# Patient Record
Sex: Female | Born: 1985 | Race: Black or African American | Hispanic: No | Marital: Single | Smoking: Current every day smoker
Health system: Southern US, Community
[De-identification: ages and names within clinical notes are randomized; demographics above are authoritative.]

## PROBLEM LIST (undated history)

## (undated) DIAGNOSIS — F419 Anxiety disorder, unspecified: Secondary | ICD-10-CM

## (undated) DIAGNOSIS — F329 Major depressive disorder, single episode, unspecified: Secondary | ICD-10-CM

## (undated) DIAGNOSIS — J45909 Unspecified asthma, uncomplicated: Secondary | ICD-10-CM

## (undated) DIAGNOSIS — F32A Depression, unspecified: Secondary | ICD-10-CM

## (undated) HISTORY — PX: ARTHROSCOPIC REPAIR ACL: SUR80

---
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
## 2012-07-30 ENCOUNTER — Encounter (HOSPITAL_BASED_OUTPATIENT_CLINIC_OR_DEPARTMENT_OTHER): Payer: Self-pay

## 2012-07-30 ENCOUNTER — Emergency Department (HOSPITAL_BASED_OUTPATIENT_CLINIC_OR_DEPARTMENT_OTHER)
Admission: EM | Admit: 2012-07-30 | Discharge: 2012-07-31 | Disposition: A | Discharge status: Home / Self Care | Payer: BC Managed Care – PPO | Attending: Emergency Medicine | Admitting: Emergency Medicine

## 2012-07-30 ENCOUNTER — Emergency Department (HOSPITAL_BASED_OUTPATIENT_CLINIC_OR_DEPARTMENT_OTHER): Payer: BC Managed Care – PPO

## 2012-07-30 DIAGNOSIS — S93401A Sprain of unspecified ligament of right ankle, initial encounter: Secondary | ICD-10-CM

## 2012-07-30 DIAGNOSIS — Y9301 Activity, walking, marching and hiking: Secondary | ICD-10-CM | POA: Insufficient documentation

## 2012-07-30 DIAGNOSIS — S93409A Sprain of unspecified ligament of unspecified ankle, initial encounter: Secondary | ICD-10-CM | POA: Insufficient documentation

## 2012-07-30 DIAGNOSIS — X500XXA Overexertion from strenuous movement or load, initial encounter: Secondary | ICD-10-CM | POA: Insufficient documentation

## 2012-07-30 DIAGNOSIS — Z87891 Personal history of nicotine dependence: Secondary | ICD-10-CM | POA: Insufficient documentation

## 2012-07-30 DIAGNOSIS — Y9289 Other specified places as the place of occurrence of the external cause: Secondary | ICD-10-CM | POA: Insufficient documentation

## 2012-07-30 NOTE — ED Notes (Signed)
Pt reports jumping of porch and tripping over stick and injuring right ankle.

## 2012-07-31 MED ORDER — HYDROCODONE-ACETAMINOPHEN 5-325 MG PO TABS: 1.0000 | ORAL_TABLET | Freq: Four times a day (QID) | ORAL | Status: DC | PRN

## 2012-07-31 MED ORDER — NAPROXEN 250 MG PO TABS
500.0000 mg | ORAL_TABLET | Freq: Once | ORAL | Status: AC
  Administered 2012-07-31: 500 mg via ORAL
  Filled 2012-07-31: qty 2

## 2012-07-31 MED ORDER — HYDROCODONE-ACETAMINOPHEN 5-325 MG PO TABS
1.0000 | ORAL_TABLET | Freq: Once | ORAL | Status: AC
  Administered 2012-07-31: 1 via ORAL
  Filled 2012-07-31: qty 1

## 2012-07-31 NOTE — ED Provider Notes (Signed)
   History    CSN: [id number] Arrival date & time 07/30/12  2311  First MD Initiated Contact with Patient 07/31/12 0033     Chief Complaint  Patient presents with  . Ankle Injury    Right   (Consider location/radiation/quality/duration/timing/severity/associated sxs/prior Treatment) HPI Is a 27 year old female who twisted her ankle tripping over or stepping in an object she did not see all walking in the dark about 2 hours ago. She is having moderate pain over the lateral aspect of her right ankle with associated swelling. Pain is worse with movement or attempted ambulation. She denies other injury. There is no numbness, weakness or tendon dysfunction of the right foot distally.  History reviewed. No pertinent past medical history. Past Surgical History  Procedure Laterality Date  . Arthroscopic repair acl     No family history on file. History  Substance Use Topics  . Smoking status: Former Smoker -- 1.00 packs/day    Types: Cigarettes  . Smokeless tobacco: Not on file  . Alcohol Use: Yes     Comment: socially   OB History   Grav Para Term Preterm Abortions TAB SAB Ect Mult Living                 Review of Systems  All other systems reviewed and are negative.    Allergies  Review of patient's allergies indicates no known allergies.  Home Medications  No current outpatient prescriptions on file.  BP 118/74  Pulse 85  Temp(Src) 99.1 F (37.3 C) (Oral)  Resp 18  Ht 5\' 6"  (1.676 m)  Wt 175 lb (79.379 kg)  BMI 28.26 kg/m2  SpO2 100%  LMP 07/09/2012  Physical Exam General: Well-developed, well-nourished female in no acute distress; appearance consistent with age of record HENT: normocephalic, atraumatic Eyes: Normal appearance Neck: supple Heart: regular rate and rhythm Lungs: Normal respiratory effort and excursion Abdomen: soft; nondistended Extremities: Tenderness and swelling over right lateral malleolus, right ankle stable, right foot distally  neurovascularly intact, no proximal fibular tenderness Neurologic: Awake, alert and oriented; motor function intact in all extremities and symmetric; no facial droop Skin: Warm and dry Psychiatric: Normal mood and affect    ED Course  Procedures (including critical care time)   MDM    Hanley Seamen, MD 07/31/12 0041

## 2012-07-31 NOTE — ED Notes (Signed)
MD at bedside. 

## 2012-08-08 ENCOUNTER — Encounter (HOSPITAL_BASED_OUTPATIENT_CLINIC_OR_DEPARTMENT_OTHER): Payer: Self-pay | Admitting: *Deleted

## 2012-08-08 ENCOUNTER — Emergency Department (HOSPITAL_BASED_OUTPATIENT_CLINIC_OR_DEPARTMENT_OTHER)
Admission: EM | Admit: 2012-08-08 | Discharge: 2012-08-08 | Disposition: A | Discharge status: Home / Self Care | Payer: BC Managed Care – PPO | Attending: Emergency Medicine | Admitting: Emergency Medicine

## 2012-08-08 DIAGNOSIS — Y929 Unspecified place or not applicable: Secondary | ICD-10-CM | POA: Insufficient documentation

## 2012-08-08 DIAGNOSIS — Y939 Activity, unspecified: Secondary | ICD-10-CM | POA: Insufficient documentation

## 2012-08-08 DIAGNOSIS — Z9889 Other specified postprocedural states: Secondary | ICD-10-CM | POA: Insufficient documentation

## 2012-08-08 DIAGNOSIS — Z87891 Personal history of nicotine dependence: Secondary | ICD-10-CM | POA: Insufficient documentation

## 2012-08-08 DIAGNOSIS — S93409A Sprain of unspecified ligament of unspecified ankle, initial encounter: Secondary | ICD-10-CM | POA: Insufficient documentation

## 2012-08-08 DIAGNOSIS — S93401D Sprain of unspecified ligament of right ankle, subsequent encounter: Secondary | ICD-10-CM

## 2012-08-08 DIAGNOSIS — W19XXXA Unspecified fall, initial encounter: Secondary | ICD-10-CM | POA: Insufficient documentation

## 2012-08-08 NOTE — ED Notes (Signed)
Pt amb to room 9 with slow, steady gait in nad. Pt reports that since spraining her right ankle, she has been wearing a brace and now her right knee is having pain. Pt denies any other c/o, eating [employer] and smiling in nad.

## 2012-08-08 NOTE — ED Provider Notes (Signed)
CSN: [id number]     Arrival date & time 08/08/12  1026 History     First MD Initiated Contact with Patient 08/08/12 1037     Chief Complaint  Patient presents with  . Knee Pain   (Consider location/radiation/quality/duration/timing/severity/associated sxs/prior Treatment) Patient is a 27 y.o. female presenting with ankle pain. The history is provided by the patient.  Ankle Pain Location:  Ankle Time since incident:  8 days Injury: yes   Mechanism of injury: fall   Ankle location:  R ankle Pain details:    Radiates to: up to right knee. Chronicity:  New Relieved by: vicodin. Worsened by:  Activity Associated symptoms: swelling (improving since injury, now only minor)   Associated symptoms: no muscle weakness, no numbness and no tingling     History reviewed. No pertinent past medical history. Past Surgical History  Procedure Laterality Date  . Arthroscopic repair acl     History reviewed. No pertinent family history. History  Substance Use Topics  . Smoking status: Former Smoker -- 1.00 packs/day    Types: Cigarettes  . Smokeless tobacco: Not on file  . Alcohol Use: Yes     Comment: socially   OB History   Grav Para Term Preterm Abortions TAB SAB Ect Mult Living                 Review of Systems  Musculoskeletal: Positive for joint swelling.  Skin: Negative for color change and wound.  Neurological: Negative for weakness and numbness.  All other systems reviewed and are negative.    Allergies  Review of patient's allergies indicates no known allergies.  Home Medications   Current Outpatient Rx  Name  Route  Sig  Dispense  Refill  . HYDROcodone-acetaminophen (NORCO/VICODIN) 5-325 MG per tablet   Oral   Take 1-2 tablets by mouth every 6 (six) hours as needed for pain.   20 tablet   0    BP 113/85  Pulse 94  Temp(Src) 98.7 F (37.1 C) (Oral)  Resp 18  Ht 5\' 7"  (1.702 m)  Wt 180 lb (81.647 kg)  BMI 28.19 kg/m2  SpO2 100%  LMP  07/09/2012 Physical Exam  Nursing note and vitals reviewed. Constitutional: She appears well-developed and well-nourished. No distress.  HENT:  Head: Normocephalic and atraumatic.  Cardiovascular:  Pulses:      Dorsalis pedis pulses are 2+ on the right side, and 2+ on the left side.  Pulmonary/Chest: Effort normal.  Abdominal: She exhibits no distension.  Musculoskeletal:       Right knee: Normal. She exhibits normal range of motion and no swelling. No tenderness found.       Right ankle: She exhibits swelling (minimal laterally). She exhibits no deformity and normal pulse. Tenderness. Lateral malleolus tenderness found. Achilles tendon normal. Achilles tendon exhibits normal Thompson's test results.       Right lower leg: Normal. She exhibits no tenderness.  Neurological: No sensory deficit. She exhibits normal muscle tone. Gait normal.  Skin: Skin is warm and dry. No pallor.    ED Course   Procedures (including critical care time)  Labs Reviewed - No data to display No results found. 1. Right ankle sprain, subsequent encounter     MDM  27 year old female who sustained a mechanical right ankle sprain 8 days ago presents with pain radiating from her ankle up to her knee. She has been wearing an ankle brace has been able to walk normally but does not walk fully on her foot  to help relieve some of the pain. She has not had any of this pain when she is at rest. She has no neuro deficits. Her fibular head is nontender. Her knee exam is normal. She states she mostly came in to have disability paperwork filled out. I told her that I was unable to fill these out as I cannot follow up with the patient. As she sees to be improving from an ankle standpoint I will discharge her with outpatient referral to PCP and an orthopedist at her request  Audree Camel, MD 08/08/12 1144

## 2012-08-08 NOTE — ED Notes (Signed)
Pt sts she is too upset and will not sign discharge. Pt sts she is going to lose her job if she doesn't see someone to fill out paperwork. Pt left without written instructions and without signing. Pt mother returned for paperwork. Pt referred to ortho.

## 2012-08-12 ENCOUNTER — Ambulatory Visit (INDEPENDENT_AMBULATORY_CARE_PROVIDER_SITE_OTHER): Payer: BC Managed Care – PPO | Admitting: Family Medicine

## 2012-08-12 ENCOUNTER — Encounter: Payer: Self-pay | Admitting: Family Medicine

## 2012-08-12 VITALS — BP 125/79 | HR 105 | Ht 67.0 in | Wt 185.0 lb

## 2012-08-12 DIAGNOSIS — S99919A Unspecified injury of unspecified ankle, initial encounter: Secondary | ICD-10-CM

## 2012-08-12 DIAGNOSIS — S99911A Unspecified injury of right ankle, initial encounter: Secondary | ICD-10-CM

## 2012-08-12 DIAGNOSIS — S93409A Sprain of unspecified ligament of unspecified ankle, initial encounter: Secondary | ICD-10-CM

## 2012-08-12 MED ORDER — HYDROCODONE-ACETAMINOPHEN 5-325 MG PO TABS: 1.0000 | ORAL_TABLET | Freq: Four times a day (QID) | ORAL | Status: DC | PRN

## 2012-08-12 NOTE — Patient Instructions (Addendum)
You have an ankle sprain. Ice the area for 15 minutes at a time, 3-4 times a day Take aleve 1-2 tabs twice a day with food for 1 week then as needed. Norco as needed for severe pain - no driving on this. Elevate above the level of your heart when possible Crutches if needed to help with walking Bear weight when tolerated Use laceup ankle brace to help with stability while you recover from this injury. Come out of the boot/brace twice a day to do Up/down and alphabet exercises 2-3 sets of each. Start physical therapy for strengthening and balance exercises and do home exercises daily that they show you. Follow up with me in 2 weeks for reevaluation. Out of work in the meantime.

## 2012-08-13 ENCOUNTER — Encounter: Payer: Self-pay | Admitting: Family Medicine

## 2012-08-13 DIAGNOSIS — S99911A Unspecified injury of right ankle, initial encounter: Secondary | ICD-10-CM | POA: Insufficient documentation

## 2012-08-13 NOTE — Assessment & Plan Note (Signed)
2/2 sprain.  Brief u/s today also negative along with prior radiographs.  Start with ROM exercises, formal PT.  Icing, aleve, norco as needed.  ASO for stability first 6 weeks.  F/u in 2 weeks for reevaluation.  Has been held out of work - needs to be ambulatory for this.  Out of work until f/u.

## 2012-08-13 NOTE — Progress Notes (Signed)
Patient ID: Linda Frank, female   DOB: [birth year], 27 y.o.   MRN: [id number]  PCP: No primary provider on file.  Subjective:   HPI: Patient is a 27 y.o. female here for right ankle injury.  Patient reports on July 23 she accidentally tripped over a Training and development officer on edge of yard and turned right ankle (inversion). Immediate swelling after this. Went to ED - x-rays negative. Taking norco, wearing aso for support. No prior ankle injuries. Has not been icing. Used crutches the first week. Has been elevating.  History reviewed. No pertinent past medical history.  No current outpatient prescriptions on file prior to visit.   No current facility-administered medications on file prior to visit.    Past Surgical History  Procedure Laterality Date  . Arthroscopic repair acl      No Known Allergies  History   Social History  . Marital Status: Single    Spouse Name: N/A    Number of Children: N/A  . Years of Education: N/A   Occupational History  . Not on file.   Social History Main Topics  . Smoking status: Current Every Day Smoker -- 0.50 packs/day    Types: Cigarettes  . Smokeless tobacco: Not on file  . Alcohol Use: Yes     Comment: socially  . Drug Use: No  . Sexually Active: Yes    Birth Control/ Protection: None   Other Topics Concern  . Not on file   Social History Narrative  . No narrative on file    Family History  Problem Relation Age of Onset  . Hypertension Mother   . Hypertension Father   . Hypertension Brother   . Sudden death Neg Hx   . Hyperlipidemia Neg Hx   . Heart attack Neg Hx   . Diabetes Neg Hx     BP 125/79  Pulse 105  Ht 5\' 7"  (1.702 m)  Wt 185 lb (83.915 kg)  BMI 28.97 kg/m2  LMP 07/09/2012  Review of Systems: See HPI above.    Objective:  Physical Exam:  Gen: NAD  R ankle: Mild lateral swelling, bruising.  No other deformity. Mild limitation motion all directions.  TTP greatest over ATFL, less lateral  malleolus. 1+ ant drawer and talar tilt.   Negative syndesmotic compression. Thompsons test negative. NV intact distally.    Assessment & Plan:  1. Right ankle injury - 2/2 sprain.  Brief u/s today also negative along with prior radiographs.  Start with ROM exercises, formal PT.  Icing, aleve, norco as needed.  ASO for stability first 6 weeks.  F/u in 2 weeks for reevaluation.  Has been held out of work - needs to be ambulatory for this.  Out of work until f/u.

## 2012-08-20 ENCOUNTER — Ambulatory Visit: Payer: BC Managed Care – PPO | Attending: Family Medicine | Admitting: Rehabilitation

## 2012-08-20 DIAGNOSIS — R609 Edema, unspecified: Secondary | ICD-10-CM | POA: Insufficient documentation

## 2012-08-20 DIAGNOSIS — M2560 Stiffness of unspecified joint, not elsewhere classified: Secondary | ICD-10-CM | POA: Insufficient documentation

## 2012-08-20 DIAGNOSIS — IMO0001 Reserved for inherently not codable concepts without codable children: Secondary | ICD-10-CM | POA: Insufficient documentation

## 2012-08-20 DIAGNOSIS — M25579 Pain in unspecified ankle and joints of unspecified foot: Secondary | ICD-10-CM | POA: Insufficient documentation

## 2012-08-22 ENCOUNTER — Ambulatory Visit: Payer: BC Managed Care – PPO | Admitting: Rehabilitation

## 2012-08-27 ENCOUNTER — Ambulatory Visit: Payer: BC Managed Care – PPO | Admitting: Rehabilitation

## 2012-08-28 ENCOUNTER — Ambulatory Visit (INDEPENDENT_AMBULATORY_CARE_PROVIDER_SITE_OTHER): Payer: BC Managed Care – PPO | Admitting: Family Medicine

## 2012-08-28 ENCOUNTER — Encounter: Payer: Self-pay | Admitting: Family Medicine

## 2012-08-28 VITALS — BP 135/90 | HR 92 | Ht 67.0 in | Wt 185.0 lb

## 2012-08-28 DIAGNOSIS — Z5189 Encounter for other specified aftercare: Secondary | ICD-10-CM

## 2012-08-28 DIAGNOSIS — S99911D Unspecified injury of right ankle, subsequent encounter: Secondary | ICD-10-CM

## 2012-08-28 DIAGNOSIS — M25579 Pain in unspecified ankle and joints of unspecified foot: Secondary | ICD-10-CM

## 2012-08-28 DIAGNOSIS — M25571 Pain in right ankle and joints of right foot: Secondary | ICD-10-CM

## 2012-08-28 MED ORDER — TRAMADOL HCL 50 MG PO TABS: 50.0000 mg | ORAL_TABLET | Freq: Three times a day (TID) | ORAL | Status: DC | PRN

## 2012-08-28 NOTE — Patient Instructions (Addendum)
You have an ankle sprain. Ice the area for 15 minutes at a time, 3-4 times a day as needed. Take advil as you have been. Tramadol as needed for severe pain - no driving on this. Elevate above the level of your heart when possible Use laceup ankle brace to help with stability while you recover from this injury. Come out of the boot/brace twice a day to do Up/down and alphabet exercises 2-3 sets of each. Continue physical therapy for strengthening and balance exercises and do home exercises daily that they show you. Follow up with me in 4 weeks for reevaluation. Light duty as written then back to full duty in 3 weeks.

## 2012-08-29 ENCOUNTER — Encounter: Payer: Self-pay | Admitting: Family Medicine

## 2012-08-29 ENCOUNTER — Ambulatory Visit: Payer: BC Managed Care – PPO | Admitting: Rehabilitation

## 2012-08-29 NOTE — Assessment & Plan Note (Signed)
2/2 sprain.  Continue with PT and home exercises.  Icing, aleve, tramadol as needed.  ASO for stability.  F/u in 4 weeks for reevaluation.  Written for light duty for next 3 weeks (see letter).

## 2012-08-29 NOTE — Progress Notes (Signed)
Patient ID: Linda Frank, female   DOB: August 25, 1985, 27 y.o.   MRN: [id number]  PCP: No primary provider on file.  Subjective:   HPI: Patient is a 27 y.o. female here for f/u right ankle injury.  8/4: Patient reports on July 23 she accidentally tripped over a Training and development officer on edge of yard and turned right ankle (inversion). Immediate swelling after this. Went to ED - x-rays negative. Taking norco, wearing aso for support. No prior ankle injuries. Has not been icing. Used crutches the first week. Has been elevating.  8/20: Patient returns with some improvement since last visit. Doing PT and home exercise program. No longer icing. Taking advil and needed, out of norco. Using aso for stability. Off crutches now.  History reviewed. No pertinent past medical history.  No current outpatient prescriptions on file prior to visit.   No current facility-administered medications on file prior to visit.    Past Surgical History  Procedure Laterality Date  . Arthroscopic repair acl      No Known Allergies  History   Social History  . Marital Status: Single    Spouse Name: N/A    Number of Children: N/A  . Years of Education: N/A   Occupational History  . Not on file.   Social History Main Topics  . Smoking status: Current Every Day Smoker -- 0.50 packs/day    Types: Cigarettes  . Smokeless tobacco: Not on file  . Alcohol Use: Yes     Comment: socially  . Drug Use: No  . Sexual Activity: Yes    Birth Control/ Protection: None   Other Topics Concern  . Not on file   Social History Narrative  . No narrative on file    Family History  Problem Relation Age of Onset  . Hypertension Mother   . Hypertension Father   . Hypertension Brother   . Sudden death Neg Hx   . Hyperlipidemia Neg Hx   . Heart attack Neg Hx   . Diabetes Neg Hx     BP 135/90  Pulse 92  Ht 5\' 7"  (1.702 m)  Wt 185 lb (83.915 kg)  BMI 28.97 kg/m2  LMP 07/09/2012  Review of  Systems: See HPI above.    Objective:  Physical Exam:  Gen: NAD  R ankle: Mild lateral swelling, bruising.  No other deformity. Mild limitation motion all directions.  TTP mildly over ATFL.  No other TTP ankle/foot. 1+ ant drawer and talar tilt.   Negative syndesmotic compression. Thompsons test negative. NV intact distally.    Assessment & Plan:  1. Right ankle injury - 2/2 sprain.  Continue with PT and home exercises.  Icing, aleve, tramadol as needed.  ASO for stability.  F/u in 4 weeks for reevaluation.  Written for light duty for next 3 weeks (see letter).

## 2012-09-03 ENCOUNTER — Ambulatory Visit: Payer: BC Managed Care – PPO | Admitting: Rehabilitation

## 2012-09-05 ENCOUNTER — Ambulatory Visit: Payer: BC Managed Care – PPO | Admitting: Rehabilitation

## 2012-09-10 ENCOUNTER — Ambulatory Visit: Payer: BC Managed Care – PPO | Attending: Family Medicine | Admitting: Rehabilitation

## 2012-09-10 DIAGNOSIS — M2560 Stiffness of unspecified joint, not elsewhere classified: Secondary | ICD-10-CM | POA: Insufficient documentation

## 2012-09-10 DIAGNOSIS — IMO0001 Reserved for inherently not codable concepts without codable children: Secondary | ICD-10-CM | POA: Insufficient documentation

## 2012-09-10 DIAGNOSIS — M25579 Pain in unspecified ankle and joints of unspecified foot: Secondary | ICD-10-CM | POA: Insufficient documentation

## 2012-09-10 DIAGNOSIS — R609 Edema, unspecified: Secondary | ICD-10-CM | POA: Insufficient documentation

## 2012-09-12 ENCOUNTER — Ambulatory Visit: Payer: BC Managed Care – PPO | Admitting: Rehabilitation

## 2012-09-17 ENCOUNTER — Ambulatory Visit: Payer: BC Managed Care – PPO | Admitting: Rehabilitation

## 2012-09-19 ENCOUNTER — Ambulatory Visit: Payer: BC Managed Care – PPO | Admitting: Rehabilitation

## 2012-09-24 ENCOUNTER — Ambulatory Visit: Payer: BC Managed Care – PPO | Admitting: Rehabilitation

## 2012-09-25 ENCOUNTER — Ambulatory Visit (INDEPENDENT_AMBULATORY_CARE_PROVIDER_SITE_OTHER): Payer: BC Managed Care – PPO | Admitting: Family Medicine

## 2012-09-25 ENCOUNTER — Encounter: Payer: Self-pay | Admitting: Family Medicine

## 2012-09-25 VITALS — BP 120/75 | HR 87 | Ht 67.0 in | Wt 150.0 lb

## 2012-09-25 DIAGNOSIS — M25579 Pain in unspecified ankle and joints of unspecified foot: Secondary | ICD-10-CM

## 2012-09-25 DIAGNOSIS — Z5189 Encounter for other specified aftercare: Secondary | ICD-10-CM

## 2012-09-25 DIAGNOSIS — S99911D Unspecified injury of right ankle, subsequent encounter: Secondary | ICD-10-CM

## 2012-09-25 DIAGNOSIS — M25571 Pain in right ankle and joints of right foot: Secondary | ICD-10-CM

## 2012-09-25 NOTE — Patient Instructions (Addendum)
Continue with current treatment for now. We will go ahead with an MRI - I'll call you the next business day to go over results.

## 2012-09-26 ENCOUNTER — Encounter: Payer: Self-pay | Admitting: Family Medicine

## 2012-09-26 ENCOUNTER — Ambulatory Visit: Payer: BC Managed Care – PPO | Admitting: Rehabilitation

## 2012-09-26 NOTE — Progress Notes (Addendum)
Patient ID: Linda Frank, female   DOB: [birth year], 27 y.o.   MRN: [id number]  PCP: No PCP Per Patient  Subjective:   HPI: Patient is a 27 y.o. female here for f/u right ankle injury.  8/4: Patient reports on July 23 she accidentally tripped over a Training and development officer on edge of yard and turned right ankle (inversion). Immediate swelling after this. Went to ED - x-rays negative. Taking norco, wearing aso for support. No prior ankle injuries. Has not been icing. Used crutches the first week. Has been elevating.  8/20: Patient returns with some improvement since last visit. Doing PT and home exercise program. No longer icing. Taking advil and needed, out of norco. Using aso for stability. Off crutches now.  9/17: Patient continues to struggle with right ankle pain laterally despite formal PT, home exercises. Was wearing brace but recently stopped using this. Swells especially after full day at work. Pain currently 4/10. No new injuries.  History reviewed. No pertinent past medical history.  Current Outpatient Prescriptions on File Prior to Visit  Medication Sig Dispense Refill  . traMADol (ULTRAM) 50 MG tablet Take 1 tablet (50 mg total) by mouth every 8 (eight) hours as needed for pain.  90 tablet  0   No current facility-administered medications on file prior to visit.    Past Surgical History  Procedure Laterality Date  . Arthroscopic repair acl      No Known Allergies  History   Social History  . Marital Status: Single    Spouse Name: N/A    Number of Children: N/A  . Years of Education: N/A   Occupational History  . Not on file.   Social History Main Topics  . Smoking status: Current Every Day Smoker -- 0.50 packs/day    Types: Cigarettes  . Smokeless tobacco: Not on file  . Alcohol Use: Yes     Comment: socially  . Drug Use: No  . Sexual Activity: Yes    Birth Control/ Protection: None   Other Topics Concern  . Not on file   Social  History Narrative  . No narrative on file    Family History  Problem Relation Age of Onset  . Hypertension Mother   . Hypertension Father   . Hypertension Brother   . Sudden death Neg Hx   . Hyperlipidemia Neg Hx   . Heart attack Neg Hx   . Diabetes Neg Hx     BP 120/75  Pulse 87  Ht 5\' 7"  (1.702 m)  Wt 150 lb (68.04 kg)  BMI 23.49 kg/m2  Review of Systems: See HPI above.    Objective:  Physical Exam:  Gen: NAD  R ankle: Mild lateral swelling.  No other deformity. Mild limitation motion all directions.  TTP mildly over ATFL, lateral ankle joint.  No other TTP ankle/foot. 1+ ant drawer and talar tilt.   Negative syndesmotic compression. Thompsons test negative. NV intact distally.    Assessment & Plan:  1. Right ankle injury - 8 weeks out from injury and still sturggling with significant pain from injury.  Will move forward with MRI to further assess for osteochondral defect, occult talus fracture.  Continue current treatment in meantime.  Continue home exercises, icing, aleve, tramadol as needed.   Addendum:  MRI results reviewed and discussed with patient.  No occult fracture.  Consistent with sprain.  Encouraged to continue with home exercises, use ASO at work.  F/u as needed.

## 2012-09-26 NOTE — Addendum Note (Signed)
Addended by: Lenda Kelp on: 09/26/2012 04:16 PM   Modules accepted: Orders

## 2012-09-26 NOTE — Assessment & Plan Note (Signed)
8 weeks out from injury and still sturggling with significant pain from injury.  Will move forward with MRI to further assess for osteochondral defect, occult talus fracture.  Continue current treatment in meantime.  Continue home exercises, icing, aleve, tramadol as needed.

## 2012-09-28 ENCOUNTER — Ambulatory Visit (HOSPITAL_BASED_OUTPATIENT_CLINIC_OR_DEPARTMENT_OTHER)
Admission: RE | Admit: 2012-09-28 | Discharge: 2012-09-28 | Disposition: A | Discharge status: Home / Self Care | Payer: BC Managed Care – PPO | Source: Ambulatory Visit | Attending: Family Medicine | Admitting: Family Medicine

## 2012-09-28 DIAGNOSIS — S93499A Sprain of other ligament of unspecified ankle, initial encounter: Secondary | ICD-10-CM | POA: Insufficient documentation

## 2012-09-28 DIAGNOSIS — M25571 Pain in right ankle and joints of right foot: Secondary | ICD-10-CM

## 2012-09-28 DIAGNOSIS — X500XXA Overexertion from strenuous movement or load, initial encounter: Secondary | ICD-10-CM | POA: Insufficient documentation

## 2012-10-01 ENCOUNTER — Ambulatory Visit: Payer: BC Managed Care – PPO | Admitting: Rehabilitation

## 2012-10-03 ENCOUNTER — Ambulatory Visit: Payer: BC Managed Care – PPO | Admitting: Rehabilitation

## 2012-10-08 ENCOUNTER — Ambulatory Visit: Payer: BC Managed Care – PPO | Admitting: Rehabilitation

## 2012-10-10 ENCOUNTER — Ambulatory Visit: Payer: BC Managed Care – PPO | Admitting: Rehabilitation

## 2012-10-15 ENCOUNTER — Ambulatory Visit: Payer: BC Managed Care – PPO | Attending: Family Medicine | Admitting: Rehabilitation

## 2012-10-15 DIAGNOSIS — IMO0001 Reserved for inherently not codable concepts without codable children: Secondary | ICD-10-CM | POA: Insufficient documentation

## 2012-10-15 DIAGNOSIS — R609 Edema, unspecified: Secondary | ICD-10-CM | POA: Insufficient documentation

## 2012-10-15 DIAGNOSIS — M25579 Pain in unspecified ankle and joints of unspecified foot: Secondary | ICD-10-CM | POA: Insufficient documentation

## 2012-10-15 DIAGNOSIS — M2560 Stiffness of unspecified joint, not elsewhere classified: Secondary | ICD-10-CM | POA: Insufficient documentation

## 2012-10-17 ENCOUNTER — Ambulatory Visit: Payer: BC Managed Care – PPO | Admitting: Rehabilitation

## 2012-10-22 ENCOUNTER — Ambulatory Visit: Payer: BC Managed Care – PPO | Admitting: Rehabilitation

## 2012-10-24 ENCOUNTER — Ambulatory Visit: Payer: BC Managed Care – PPO | Admitting: Rehabilitation

## 2015-08-18 ENCOUNTER — Encounter (HOSPITAL_BASED_OUTPATIENT_CLINIC_OR_DEPARTMENT_OTHER): Payer: Self-pay | Admitting: Emergency Medicine

## 2015-08-18 ENCOUNTER — Emergency Department (HOSPITAL_BASED_OUTPATIENT_CLINIC_OR_DEPARTMENT_OTHER)
Admission: EM | Admit: 2015-08-18 | Discharge: 2015-08-19 | Disposition: A | Discharge status: Home / Self Care | Payer: BLUE CROSS/BLUE SHIELD | Attending: Emergency Medicine | Admitting: Emergency Medicine

## 2015-08-18 DIAGNOSIS — J45909 Unspecified asthma, uncomplicated: Secondary | ICD-10-CM | POA: Diagnosis not present

## 2015-08-18 DIAGNOSIS — R002 Palpitations: Secondary | ICD-10-CM | POA: Diagnosis not present

## 2015-08-18 DIAGNOSIS — F17210 Nicotine dependence, cigarettes, uncomplicated: Secondary | ICD-10-CM | POA: Diagnosis not present

## 2015-08-18 DIAGNOSIS — F419 Anxiety disorder, unspecified: Secondary | ICD-10-CM | POA: Insufficient documentation

## 2015-08-18 DIAGNOSIS — R0602 Shortness of breath: Secondary | ICD-10-CM | POA: Diagnosis present

## 2015-08-18 HISTORY — DX: Anxiety disorder, unspecified: F41.9

## 2015-08-18 HISTORY — DX: Major depressive disorder, single episode, unspecified: F32.9

## 2015-08-18 HISTORY — DX: Depression, unspecified: F32.A

## 2015-08-18 HISTORY — DX: Unspecified asthma, uncomplicated: J45.909

## 2015-08-18 NOTE — ED Triage Notes (Signed)
Pt c/o shortness of breath. Pt has hx of asthma. Pt states there is mold in her house and there are renovations going on.  Pt also c/o nausea, palpitations, and rash.

## 2015-08-18 NOTE — ED Provider Notes (Signed)
[facility] Provider Note   CSN: [id number] Arrival date & time: 08/18/15  2331  First Provider Contact:  First MD Initiated Contact with Patient 08/18/15 2342     By signing my name below, I, Emmanuella Mensah, attest that this documentation has been prepared under the direction and in the presence of Roxy Horseman, PA-C. Electronically Signed: Angelene Giovanni, ED Scribe. 08/18/15. 11:53 PM.    History   Chief Complaint Chief Complaint  Patient presents with  . Shortness of Breath   HPI Comments: Linda Frank is a 30 y.o. female with a hx of Asthma who presents to the Emergency Department complaining of ongoing moderate shortness of breath onset this am when she woke up. She reports associated heart palpitations. She explains that she recently discovered black mold at her home where she has lived for 5 years. She reports that she saw her PCP for these symptoms and was prescribed Xanax and a "depression medication". No other alleviating factors noted. Pt has not tried any medications PTA. No fever, chills, cough, rash, chest pain, or n/v.   The history is provided by the patient. No language interpreter was used.    Past Medical History:  Diagnosis Date  . Asthma     Patient Active Problem List   Diagnosis Date Noted  . Right ankle injury 08/13/2012    Past Surgical History:  Procedure Laterality Date  . ARTHROSCOPIC REPAIR ACL      OB History    No data available       Home Medications    Prior to Admission medications   Medication Sig Start Date End Date Taking? Authorizing Provider  traMADol (ULTRAM) 50 MG tablet Take 1 tablet (50 mg total) by mouth every 8 (eight) hours as needed for pain. 08/28/12   Lenda Kelp, MD    Family History Family History  Problem Relation Age of Onset  . Hypertension Mother   . Hypertension Father   . Hypertension Brother   . Sudden death Neg Hx   . Hyperlipidemia Neg Hx   . Heart attack Neg Hx   .  Diabetes Neg Hx     Social History Social History  Substance Use Topics  . Smoking status: Current Every Day Smoker    Packs/day: 0.50    Types: Cigarettes  . Smokeless tobacco: Never Used  . Alcohol use Yes     Comment: socially     Allergies   Review of patient's allergies indicates no known allergies.   Review of Systems Review of Systems  Constitutional: Negative for chills and fever.  Respiratory: Positive for shortness of breath. Negative for cough.   Cardiovascular: Positive for palpitations. Negative for chest pain.  Gastrointestinal: Negative for nausea and vomiting.  Skin: Negative for rash.     Physical Exam Updated Vital Signs BP 156/87   Pulse 93   Temp 98.2 F (36.8 C) (Oral)   Resp 18   Ht  (1.676 m)   Wt 200 lb (90.7 kg)   SpO2 100%   BMI 32.28 kg/m   Physical Exam  Constitutional: She is oriented to person, place, and time. She appears well-developed and well-nourished.  HENT:  Head: Normocephalic and atraumatic.  Eyes: Conjunctivae and EOM are normal. Pupils are equal, round, and reactive to light.  Neck: Normal range of motion. Neck supple.  Cardiovascular: Normal rate and regular rhythm.  Exam reveals no gallop and no friction rub.   No murmur heard. Pulmonary/Chest: Effort normal and  breath sounds normal. No respiratory distress. She has no wheezes. She has no rales. She exhibits no tenderness.  CTAB  Abdominal: Soft. Bowel sounds are normal. She exhibits no distension and no mass. There is no tenderness. There is no rebound and no guarding.  Musculoskeletal: Normal range of motion. She exhibits no edema or tenderness.  Neurological: She is alert and oriented to person, place, and time.  Skin: Skin is warm and dry.  Psychiatric: She has a normal mood and affect. Her behavior is normal. Judgment and thought content normal.  anxious  Nursing note and vitals reviewed.    ED Treatments / Results  DIAGNOSTIC STUDIES: Oxygen  Saturation is 100% on RA, normal by my interpretation.    COORDINATION OF CARE: 11:50 PM - Pt's parents advised of plan for treatment and pt's parents agree. Pt counseled on anxiety related to the discovery of mold.    Labs (all labs ordered are listed, but only abnormal results are displayed) Labs Reviewed - No data to display  EKG  EKG Interpretation None       Radiology No results found.  Procedures Procedures (including critical care time)  Medications Ordered in ED Medications - No data to display   Initial Impression / Assessment and Plan / ED Course  Roxy Horsemanobert Casimer Russett, PA-C has reviewed the triage vital signs and the nursing notes.  Pertinent labs & imaging results that were available during my care of the patient were reviewed by me and considered in my medical decision making (see chart for details).  Clinical Course    Patient with anxiety.  States that she awoke this evening with some heart palpitations.  She doesn't have chest pain.  She states that her doctor recently gave her something for anxiety and depression.  She states that it isn't working.  She is concerned because she lives in a home where black mold was found. Her lungs are clear.  No cough.  No fever.  Recommend PCP follow-up.  Final Clinical Impressions(s) / ED Diagnoses   Final diagnoses:  Anxiety   I personally performed the services described in this documentation, which was scribed in my presence. The recorded information has been reviewed and is accurate.     New Prescriptions New Prescriptions   No medications on file     Roxy HorsemanRobert Viki Carrera, PA-C 08/19/15 0007    Paula LibraJohn Molpus, MD 08/19/15 [phone]

## 2015-08-18 NOTE — ED Notes (Signed)
Pt has generalized complaints of tiredness, SOB, cough, intermittent nausea for the last 2-3 years.  She states that her apartment is being renovated and they found a lot of mold and was wondering if there was a test to see if her symptoms were from mold.  Pt's lungs are clear, she is able to speak in complete sentences without difficulty and is not in any acute distress currently.

## 2015-08-19 NOTE — ED Notes (Signed)
Pt verbalizes understanding of d/c instructions and denies any further needs at this time. 

## 2016-03-07 ENCOUNTER — Emergency Department (HOSPITAL_BASED_OUTPATIENT_CLINIC_OR_DEPARTMENT_OTHER): Payer: BLUE CROSS/BLUE SHIELD

## 2016-03-07 ENCOUNTER — Encounter (HOSPITAL_BASED_OUTPATIENT_CLINIC_OR_DEPARTMENT_OTHER): Payer: Self-pay | Admitting: Emergency Medicine

## 2016-03-07 ENCOUNTER — Emergency Department (HOSPITAL_BASED_OUTPATIENT_CLINIC_OR_DEPARTMENT_OTHER)
Admission: EM | Admit: 2016-03-07 | Discharge: 2016-03-07 | Disposition: A | Discharge status: Home / Self Care | Payer: BLUE CROSS/BLUE SHIELD | Attending: Emergency Medicine | Admitting: Emergency Medicine

## 2016-03-07 DIAGNOSIS — Y929 Unspecified place or not applicable: Secondary | ICD-10-CM | POA: Insufficient documentation

## 2016-03-07 DIAGNOSIS — W231XXA Caught, crushed, jammed, or pinched between stationary objects, initial encounter: Secondary | ICD-10-CM | POA: Insufficient documentation

## 2016-03-07 DIAGNOSIS — J45909 Unspecified asthma, uncomplicated: Secondary | ICD-10-CM | POA: Insufficient documentation

## 2016-03-07 DIAGNOSIS — M79645 Pain in left finger(s): Secondary | ICD-10-CM | POA: Insufficient documentation

## 2016-03-07 DIAGNOSIS — Y999 Unspecified external cause status: Secondary | ICD-10-CM | POA: Insufficient documentation

## 2016-03-07 DIAGNOSIS — Y9389 Activity, other specified: Secondary | ICD-10-CM | POA: Insufficient documentation

## 2016-03-07 DIAGNOSIS — G8929 Other chronic pain: Secondary | ICD-10-CM | POA: Insufficient documentation

## 2016-03-07 DIAGNOSIS — Z79899 Other long term (current) drug therapy: Secondary | ICD-10-CM | POA: Insufficient documentation

## 2016-03-07 DIAGNOSIS — F17210 Nicotine dependence, cigarettes, uncomplicated: Secondary | ICD-10-CM | POA: Insufficient documentation

## 2016-03-07 DIAGNOSIS — M79641 Pain in right hand: Secondary | ICD-10-CM | POA: Insufficient documentation

## 2016-03-07 NOTE — ED Triage Notes (Signed)
Pain to L index finger after jamming it while moving a couch. Swelling noted. Pain to R hand across the knuckles x 1-2 months, described as an ache, no known injury.

## 2016-03-07 NOTE — ED Provider Notes (Signed)
[facility]   CSN: [id number] Arrival date & time: 03/07/16  0850     History   Chief Complaint Chief Complaint  Patient presents with  . Hand Pain    HPI Linda Frank is a 31 y.o. right-handed female who presents with chronic right hand pain and acute left index finger pain. She states that her right hand pain has been ongoing for several months. It is worse when she grips or makes a fist. The pain is over the top of her knuckles between the index and ring finger. It is better when she rests and uses a brace. She has been seen by her PCP for this problem who gave her pain medication but this did not relieve her pain completely. Her job involves using her hands constantly. She denies numbness, tingling, or weakness of her hand. She has been taking 600-800 mg ibuprofen with some relief of her pain.  Her left index finger she hurt yesterday while she was moving a couch. He got stuck in between the couch and the door and she has some swelling on the dorsal aspect of her finger. She has full range of motion of that finger but it is painful.  HPI  Past Medical History:  Diagnosis Date  . Anxiety   . Asthma   . Depressive disorder     Patient Active Problem List   Diagnosis Date Noted  . Right ankle injury 08/13/2012    Past Surgical History:  Procedure Laterality Date  . ARTHROSCOPIC REPAIR ACL      OB History    No data available       Home Medications    Prior to Admission medications   Medication Sig Start Date End Date Taking? Authorizing Provider  FLUoxetine (PROZAC) 40 MG capsule Take 40 mg by mouth daily.   Yes Historical Provider, MD  hydrOXYzine (VISTARIL) 50 MG capsule Take 50 mg by mouth 3 (three) times daily as needed.    Yes Historical Provider, MD  traMADol (ULTRAM) 50 MG tablet Take 1 tablet (50 mg total) by mouth every 8 (eight) hours as needed for pain. 08/28/12   Lenda Kelp, MD    Family History Family History    Problem Relation Age of Onset  . Hypertension Mother   . Hypertension Father   . Hypertension Brother   . Sudden death Neg Hx   . Hyperlipidemia Neg Hx   . Heart attack Neg Hx   . Diabetes Neg Hx     Social History Social History  Substance Use Topics  . Smoking status: Current Every Day Smoker    Packs/day: 0.50    Types: Cigarettes  . Smokeless tobacco: Never Used  . Alcohol use Yes     Comment: socially     Allergies   Patient has no known allergies.   Review of Systems Review of Systems  Musculoskeletal: Positive for arthralgias and joint swelling.  Skin: Negative for wound.  Neurological: Negative for weakness and numbness.     Physical Exam Updated Vital Signs BP 124/72 (BP Location: Right Arm)   Pulse 73   Temp 98.4 F (36.9 C) (Oral)   Resp 18   Ht 5\' 7"  (1.702 m)   Wt 104.3 kg   LMP 02/24/2016   SpO2 100%   BMI 36.02 kg/m   Physical Exam  Constitutional: She is oriented to person, place, and time. She appears well-developed and well-nourished. No distress.  HENT:  Head: Normocephalic and atraumatic.  Eyes: Conjunctivae are normal. Pupils are equal, round, and reactive to light. Right eye exhibits no discharge. Left eye exhibits no discharge. No scleral icterus.  Neck: Normal range of motion.  Cardiovascular: Normal rate.   Pulmonary/Chest: Effort normal. No respiratory distress.  Abdominal: She exhibits no distension.  Musculoskeletal:  Right hand: No obvious swelling or deformity. No tenderness to palpation but she has pain in between the index and middle finger and middle and ring finger with flexion. FROM. N/V intact.  Left hand: Mild swelling of dorsal aspect of left index finger. No deformity. Mild tenderness to palpation. FROM of finger. N/V intact.   Neurological: She is alert and oriented to person, place, and time.  Skin: Skin is warm and dry.  Psychiatric: She has a normal mood and affect. Her behavior is normal.  Nursing note and  vitals reviewed.    ED Treatments / Results  Labs (all labs ordered are listed, but only abnormal results are displayed) Labs Reviewed - No data to display  EKG  EKG Interpretation None       Radiology Dg Hand Complete Right  Result Date: 03/07/2016 CLINICAL DATA:  31 year old female with left index finger pain and swelling after trauma 2 days ago. Initial encounter. EXAM: RIGHT HAND - COMPLETE 3+ VIEW COMPARISON:  07/12/2015. Left index finger films same date dictated separately. FINDINGS: There is no evidence of fracture or dislocation. There is no evidence of arthropathy or other focal bone abnormality. Soft tissues are unremarkable. IMPRESSION: Negative. Electronically Signed   By: Lacy DuverneySteven  Olson M.D.   On: 03/07/2016 09:40   Dg Finger Index Left  Result Date: 03/07/2016 CLINICAL DATA:  31 year old female with left index finger pain and swelling after trauma 2 days ago. Initial encounter. EXAM: LEFT INDEX FINGER 2+V COMPARISON:  Left hand same date dictated separately. FINDINGS: No fracture or dislocation. IMPRESSION: No fracture or dislocation. Electronically Signed   By: Lacy DuverneySteven  Olson M.D.   On: 03/07/2016 09:38    Procedures Procedures (including critical care time)  Medications Ordered in ED Medications - No data to display   Initial Impression / Assessment and Plan / ED Course  I have reviewed the triage vital signs and the nursing notes.  Pertinent labs & imaging results that were available during my care of the patient were reviewed by me and considered in my medical decision making (see chart for details).  31 year old female presents with chronic right hand pain likely due to overuse from her job. As her pain is been going on for several months ago without resolution advised her to follow up with hand specialist. In the meantime encouraged NSAIDs, ice, bracing as needed. X-ray is negative for any bony pathology. X-ray of Left index finger also negative for any bony  pathology. She has full range of motion of the finger or doubt tendon injury. Advised supportive care and follow-up with PCP. Return precautions given.  Final Clinical Impressions(s) / ED Diagnoses   Final diagnoses:  Chronic hand pain, right  Finger pain, left    New Prescriptions Discharge Medication List as of 03/07/2016 10:33 AM       Bethel BornKelly Marie Labrina Lines, PA-C 03/08/16 1044    Pricilla LovelessScott Goldston, MD 03/09/16 2100

## 2016-03-07 NOTE — Discharge Instructions (Signed)
Take Ibuprofen as needed for pain. You can take Tylenol with this medicine Ice hand as needed Wear brace as needed Follow up with Dr. Amanda PeaGramig for further evaluation

## 2016-06-03 ENCOUNTER — Encounter (HOSPITAL_BASED_OUTPATIENT_CLINIC_OR_DEPARTMENT_OTHER): Payer: Self-pay | Admitting: Emergency Medicine

## 2016-06-03 ENCOUNTER — Emergency Department (HOSPITAL_BASED_OUTPATIENT_CLINIC_OR_DEPARTMENT_OTHER)
Admission: EM | Admit: 2016-06-03 | Discharge: 2016-06-03 | Disposition: A | Discharge status: Home / Self Care | Payer: BLUE CROSS/BLUE SHIELD | Attending: Emergency Medicine | Admitting: Emergency Medicine

## 2016-06-03 DIAGNOSIS — Y999 Unspecified external cause status: Secondary | ICD-10-CM | POA: Insufficient documentation

## 2016-06-03 DIAGNOSIS — W2103XA Struck by baseball, initial encounter: Secondary | ICD-10-CM | POA: Insufficient documentation

## 2016-06-03 DIAGNOSIS — J45909 Unspecified asthma, uncomplicated: Secondary | ICD-10-CM | POA: Insufficient documentation

## 2016-06-03 DIAGNOSIS — S058X2A Other injuries of left eye and orbit, initial encounter: Secondary | ICD-10-CM | POA: Insufficient documentation

## 2016-06-03 DIAGNOSIS — Y9239 Other specified sports and athletic area as the place of occurrence of the external cause: Secondary | ICD-10-CM | POA: Insufficient documentation

## 2016-06-03 DIAGNOSIS — F17210 Nicotine dependence, cigarettes, uncomplicated: Secondary | ICD-10-CM | POA: Insufficient documentation

## 2016-06-03 DIAGNOSIS — Y9364 Activity, baseball: Secondary | ICD-10-CM | POA: Insufficient documentation

## 2016-06-03 DIAGNOSIS — S0592XA Unspecified injury of left eye and orbit, initial encounter: Secondary | ICD-10-CM

## 2016-06-03 MED ORDER — FLUORESCEIN SODIUM 0.6 MG OP STRP
ORAL_STRIP | OPHTHALMIC | Status: AC
  Filled 2016-06-03: qty 1

## 2016-06-03 MED ORDER — TETRACAINE HCL 0.5 % OP SOLN
OPHTHALMIC | Status: AC
  Administered 2016-06-03: 1 [drp] via OPHTHALMIC
  Filled 2016-06-03: qty 4

## 2016-06-03 MED ORDER — TETRACAINE HCL 0.5 % OP SOLN: 1.0000 [drp] | Freq: Once | OPHTHALMIC | Status: AC

## 2016-06-03 NOTE — ED Triage Notes (Signed)
PT presents to ED with complaints of left eye pain after [facility]

## 2016-06-03 NOTE — ED Provider Notes (Signed)
[facility]   CSN: [id number] Arrival date & time: 06/03/16  2143  By signing my name below, I, Thelma Barge, attest that this documentation has been prepared under the direction and in the presence of Dalisha Shively PA-C. Electronically Signed: Thelma Barge, Scribe. 06/03/16. 10:37 PM.  History   Chief Complaint Chief Complaint  Patient presents with  . Eye Injury   The history is provided by the patient. No language interpreter was used.   HPI Comments: Linda Frank is a 31 y.o. female who presents to the Emergency Department complaining of constant, gradually worsening left eye pain s/p getting hit in the left eye socket with a baseball that occurred 8 hours ago. She describes the pain as a pressure and it worsens when she moves her eyeball around and exposed to bright lights. She has associated sensitivity to light and mild dizziness. She denies nausea, vomiting, difficulty walking, LOC, head injury, blurry vision, Double vision, loss of vision She has no pertinent history of illness or disease.   Past Medical History:  Diagnosis Date  . Anxiety   . Asthma   . Depressive disorder     Patient Active Problem List   Diagnosis Date Noted  . Right ankle injury 08/13/2012    Past Surgical History:  Procedure Laterality Date  . ARTHROSCOPIC REPAIR ACL      OB History    No data available       Home Medications    Prior to Admission medications   Medication Sig Start Date End Date Taking? Authorizing Provider  FLUoxetine (PROZAC) 40 MG capsule Take 40 mg by mouth daily.    [provider]  hydrOXYzine (VISTARIL) 50 MG capsule Take 50 mg by mouth 3 (three) times daily as needed.     [provider]  traMADol (ULTRAM) 50 MG tablet Take 1 tablet (50 mg total) by mouth every 8 (eight) hours as needed for pain. 08/28/12   Lenda Kelp, MD    Family History Family History  Problem Relation Age of Onset  . Hypertension  Mother   . Hypertension Father   . Hypertension Brother   . Sudden death Neg Hx   . Hyperlipidemia Neg Hx   . Heart attack Neg Hx   . Diabetes Neg Hx     Social History Social History  Substance Use Topics  . Smoking status: Current Every Day Smoker    Packs/day: 0.50    Types: Cigarettes  . Smokeless tobacco: Never Used  . Alcohol use Yes     Comment: socially     Allergies   Patient has no known allergies.   Review of Systems Review of Systems  Constitutional: Negative for fever.  HENT: Negative for facial swelling and nosebleeds.   Eyes: Positive for photophobia and pain (left eye). Negative for visual disturbance.  Gastrointestinal: Negative for nausea and vomiting.  Neurological: Positive for dizziness (mild) and headaches. Negative for syncope.     Physical Exam Updated Vital Signs BP 127/76   Pulse 89   Temp 98.9 F (37.2 C) (Oral) Comment (Src): vitals taken by Baxter Hire, Pegram,EMT  Resp 18   LMP 05/08/2016   SpO2 99%   Physical Exam  Constitutional: She appears well-developed and well-nourished. No distress.  HENT:  Head: Normocephalic and atraumatic.  Eyes: EOM are normal. Pupils are equal, round, and reactive to light. Right eye exhibits no discharge. Left eye exhibits no discharge. Left conjunctiva is injected. No scleral icterus. Left eye  exhibits normal extraocular motion.  Pain/discomfort with eye movements  Neck: Normal range of motion.  Pulmonary/Chest: Effort normal. No respiratory distress.  Neurological: She is alert. She exhibits normal muscle tone.  Skin: No rash noted. She is not diaphoretic.  Psychiatric: She has a normal mood and affect.  Nursing note and vitals reviewed.    ED Treatments / Results  DIAGNOSTIC STUDIES: Oxygen Saturation is 99% on room air, normal by my interpretation.    COORDINATION OF CARE: 10:35 PM Discussed treatment plan with pt at bedside and pt agreed to plan.  Labs (all labs ordered are listed, but  only abnormal results are displayed) Labs Reviewed - No data to display  EKG  EKG Interpretation None       Radiology No results found.  Procedures Procedures (including critical care time)  Medications Ordered in ED Medications  fluorescein 0.6 MG ophthalmic strip (not administered)  tetracaine (PONTOCAINE) 0.5 % ophthalmic solution 1 drop (1 drop Left Eye Given by Other 06/03/16 2327)     Initial Impression / Assessment and Plan / ED Course  I have reviewed the triage vital signs and the nursing notes.  Pertinent labs & imaging results that were available during my care of the patient were reviewed by me and considered in my medical decision making (see chart for details).     Patient's history and symptoms concerning for left eye iritis. Ocular pressure was checked and was 19. There is injection of the conjunctiva on the left side. Pupils are equal and reactive bilaterally, and not deformed. Patient has no blurry vision, double vision or loss of vision. No concern for any hematoma behind the eye. Patient denies loss of consciousness or neurological deficits. No need for further imaging at this time. I advised her to wear sunglasses, no contact lenses and should follow up with ophthalmologist as soon as possible. Continue anti-inflammatories for pain control.  Final Clinical Impressions(s) / ED Diagnoses   Final diagnoses:  Left eye injury, initial encounter    New Prescriptions Discharge Medication List as of 06/03/2016 11:27 PM     I personally performed the services described in this documentation, which was scribed in my presence. The recorded information has been reviewed and is accurate.     Dietrich PatesKhatri, Rashida Ladouceur, PA-C 06/04/16 [id number]    Gwyneth SproutPlunkett, Whitney, MD 06/04/16 [phone]

## 2016-06-03 NOTE — ED Notes (Signed)
Pt was struck by a ball in the L eye earlier this afternoon. Now c/o blurred vision, and photosensitivity.

## 2016-06-03 NOTE — Discharge Instructions (Signed)
Do not wear contact lenses until being seen by eye doctor. Wear sunglasses when exposed to sunlight or bright lights. Follow-up with eye doctor listed below for further evaluation. Continue ibuprofen for pain and discomfort. Return to ED for worsening pain, additional injury, vision changes, loss of vision, headache injury or loss of consciousness.

## 2018-09-07 ENCOUNTER — Emergency Department (HOSPITAL_BASED_OUTPATIENT_CLINIC_OR_DEPARTMENT_OTHER): Payer: Self-pay

## 2018-09-07 ENCOUNTER — Emergency Department (HOSPITAL_BASED_OUTPATIENT_CLINIC_OR_DEPARTMENT_OTHER)
Admission: EM | Admit: 2018-09-07 | Discharge: 2018-09-07 | Disposition: A | Discharge status: Home / Self Care | Payer: Self-pay | Attending: Emergency Medicine | Admitting: Emergency Medicine

## 2018-09-07 ENCOUNTER — Other Ambulatory Visit: Payer: Self-pay

## 2018-09-07 ENCOUNTER — Encounter (HOSPITAL_BASED_OUTPATIENT_CLINIC_OR_DEPARTMENT_OTHER): Payer: Self-pay | Admitting: Emergency Medicine

## 2018-09-07 DIAGNOSIS — Y998 Other external cause status: Secondary | ICD-10-CM | POA: Insufficient documentation

## 2018-09-07 DIAGNOSIS — Y9389 Activity, other specified: Secondary | ICD-10-CM | POA: Insufficient documentation

## 2018-09-07 DIAGNOSIS — Y9289 Other specified places as the place of occurrence of the external cause: Secondary | ICD-10-CM | POA: Insufficient documentation

## 2018-09-07 DIAGNOSIS — S93402A Sprain of unspecified ligament of left ankle, initial encounter: Secondary | ICD-10-CM | POA: Insufficient documentation

## 2018-09-07 DIAGNOSIS — X500XXA Overexertion from strenuous movement or load, initial encounter: Secondary | ICD-10-CM | POA: Insufficient documentation

## 2018-09-07 NOTE — ED Provider Notes (Signed)
[facility] EMERGENCY DEPARTMENT Provider Note   CSN: [id number] Arrival date & time: 09/07/18  1016     History   Chief Complaint Chief Complaint  Patient presents with  . Ankle Injury    HPI Linda Frank is a 33 y.o. female.     HPI Patient presents with left ankle pain.  States she was at work and twisted her left ankle last night.  States she felt 3 pops in it.  States is able to walk on it but there is pain.  Patient states she thinks she could have broken her fibula.  No other injury. Past Medical History:  Diagnosis Date  . Anxiety   . Asthma   . Depressive disorder     Patient Active Problem List   Diagnosis Date Noted  . Right ankle injury 08/13/2012    Past Surgical History:  Procedure Laterality Date  . ARTHROSCOPIC REPAIR ACL       OB History   No obstetric history on file.      Home Medications    Prior to Admission medications   Medication Sig Start Date End Date Taking? Authorizing Provider  FLUoxetine (PROZAC) 40 MG capsule Take 40 mg by mouth daily.    [provider]  hydrOXYzine (VISTARIL) 50 MG capsule Take 50 mg by mouth 3 (three) times daily as needed.     [provider]  traMADol (ULTRAM) 50 MG tablet Take 1 tablet (50 mg total) by mouth every 8 (eight) hours as needed for pain. 08/28/12   Dene Gentry, MD    Family History Family History  Problem Relation Age of Onset  . Hypertension Mother   . Hypertension Father   . Hypertension Brother   . Sudden death Neg Hx   . Hyperlipidemia Neg Hx   . Heart attack Neg Hx   . Diabetes Neg Hx     Social History Social History   Tobacco Use  . Smoking status: Current Every Day Smoker    Packs/day: 0.50    Types: Cigarettes  . Smokeless tobacco: Never Used  Substance Use Topics  . Alcohol use: Yes    Comment: daily  . Drug use: No     Allergies   Patient has no known allergies.   Review of Systems Review of Systems  Constitutional:  Negative for appetite change.  Musculoskeletal:       Left ankle pain and swelling.  Skin: Negative for wound.  Neurological: Negative for weakness and numbness.     Physical Exam Updated Vital Signs BP 139/84 (BP Location: Right Arm)   Pulse 78   Temp 97.9 F (36.6 C) (Oral)   Resp 16   Ht 5\' 8"  (1.727 m)   Wt 93 kg   LMP 08/15/2018   SpO2 100%   BMI 31.17 kg/m   Physical Exam Vitals signs and nursing note reviewed.  Musculoskeletal:     Comments: Tenderness and swelling over left lateral malleolus.  No tenderness over the foot.  No tenderness over proximal fibula.  Skin intact.  Sensation intact.  Neurological:     Mental Status: She is alert. Mental status is at baseline.      ED Treatments / Results  Labs (all labs ordered are listed, but only abnormal results are displayed) Labs Reviewed - No data to display  EKG None  Radiology No results found.  Procedures Procedures (including critical care time)  Medications Ordered in ED Medications - No data to display  Initial Impression / Assessment and Plan / ED Course  I have reviewed the triage vital signs and the nursing notes.  Pertinent labs & imaging results that were available during my care of the patient were reviewed by me and considered in my medical decision making (see chart for details).        Patient with left ankle sprain.  X-ray reassuring.  ASO given.  Outpatient follow-up with sports medicine as needed.  Final Clinical Impressions(s) / ED Diagnoses   Final diagnoses:  None    ED Discharge Orders    None       Benjiman CorePickering, Abdulkarim Eberlin, MD 09/07/18 1156

## 2018-09-07 NOTE — ED Triage Notes (Signed)
Twisted her L ankle last night at work when she stepped down off a ledge.

## 2018-09-25 ENCOUNTER — Ambulatory Visit: Payer: Self-pay | Admitting: Family Medicine

## 2018-09-25 NOTE — Progress Notes (Deleted)
  Linda Frank - 33 y.o. female MRN [id number]  Date of birth: 12/04/1985  SUBJECTIVE:  Including CC & ROS.  No chief complaint on file.   Linda Frank is a 33 y.o. female that is  ***.  ***   Review of Systems  HISTORY: Past Medical, Surgical, Social, and Family History Reviewed & Updated per EMR.   Pertinent Historical Findings include:  Past Medical History:  Diagnosis Date  . Anxiety   . Asthma   . Depressive disorder     Past Surgical History:  Procedure Laterality Date  . ARTHROSCOPIC REPAIR ACL      No Known Allergies  Family History  Problem Relation Age of Onset  . Hypertension Mother   . Hypertension Father   . Hypertension Brother   . Sudden death Neg Hx   . Hyperlipidemia Neg Hx   . Heart attack Neg Hx   . Diabetes Neg Hx      Social History   Socioeconomic History  . Marital status: Significant Other    Spouse name: Not on file  . Number of children: Not on file  . Years of education: Not on file  . Highest education level: Not on file  Occupational History  . Not on file  Social Needs  . Financial resource strain: Not on file  . Food insecurity    Worry: Not on file    Inability: Not on file  . Transportation needs    Medical: Not on file    Non-medical: Not on file  Tobacco Use  . Smoking status: Current Every Day Smoker    Packs/day: 0.50    Types: Cigarettes  . Smokeless tobacco: Never Used  Substance and Sexual Activity  . Alcohol use: Yes    Comment: daily  . Drug use: No  . Sexual activity: Yes    Birth control/protection: None  Lifestyle  . Physical activity    Days per week: Not on file    Minutes per session: Not on file  . Stress: Not on file  Relationships  . Social Herbalist on phone: Not on file    Gets together: Not on file    Attends religious service: Not on file    Active member of club or organization: Not on file    Attends meetings of clubs or organizations: Not on file   Relationship status: Not on file  . Intimate partner violence    Fear of current or ex partner: Not on file    Emotionally abused: Not on file    Physically abused: Not on file    Forced sexual activity: Not on file  Other Topics Concern  . Not on file  Social History Narrative  . Not on file     PHYSICAL EXAM:  VS: There were no vitals taken for this visit. Physical Exam Gen: NAD, alert, cooperative with exam, well-appearing ENT: normal lips, normal nasal mucosa,  Eye: normal EOM, normal conjunctiva and lids CV:  no edema, +2 pedal pulses   Resp: no accessory muscle use, non-labored,  GI: no masses or tenderness, no hernia  Skin: no rashes, no areas of induration  Neuro: normal tone, normal sensation to touch Psych:  normal insight, alert and oriented MSK:  ***      ASSESSMENT & PLAN:   No problem-specific Assessment & Plan notes found for this encounter.

## 2018-11-17 ENCOUNTER — Encounter (HOSPITAL_BASED_OUTPATIENT_CLINIC_OR_DEPARTMENT_OTHER): Payer: Self-pay

## 2018-11-17 ENCOUNTER — Emergency Department (HOSPITAL_BASED_OUTPATIENT_CLINIC_OR_DEPARTMENT_OTHER)
Admission: EM | Admit: 2018-11-17 | Discharge: 2018-11-17 | Disposition: A | Discharge status: Home / Self Care | Payer: BC Managed Care – PPO | Attending: Emergency Medicine | Admitting: Emergency Medicine

## 2018-11-17 ENCOUNTER — Other Ambulatory Visit: Payer: Self-pay

## 2018-11-17 ENCOUNTER — Emergency Department (HOSPITAL_BASED_OUTPATIENT_CLINIC_OR_DEPARTMENT_OTHER): Payer: BC Managed Care – PPO

## 2018-11-17 DIAGNOSIS — S0591XA Unspecified injury of right eye and orbit, initial encounter: Secondary | ICD-10-CM | POA: Diagnosis present

## 2018-11-17 DIAGNOSIS — S60222A Contusion of left hand, initial encounter: Secondary | ICD-10-CM

## 2018-11-17 DIAGNOSIS — S0231XA Fracture of orbital floor, right side, initial encounter for closed fracture: Secondary | ICD-10-CM | POA: Diagnosis not present

## 2018-11-17 DIAGNOSIS — Y999 Unspecified external cause status: Secondary | ICD-10-CM | POA: Diagnosis not present

## 2018-11-17 DIAGNOSIS — S60221A Contusion of right hand, initial encounter: Secondary | ICD-10-CM

## 2018-11-17 DIAGNOSIS — Y929 Unspecified place or not applicable: Secondary | ICD-10-CM | POA: Diagnosis not present

## 2018-11-17 DIAGNOSIS — F17210 Nicotine dependence, cigarettes, uncomplicated: Secondary | ICD-10-CM | POA: Diagnosis not present

## 2018-11-17 DIAGNOSIS — J45909 Unspecified asthma, uncomplicated: Secondary | ICD-10-CM | POA: Insufficient documentation

## 2018-11-17 DIAGNOSIS — Y9389 Activity, other specified: Secondary | ICD-10-CM | POA: Insufficient documentation

## 2018-11-17 MED ORDER — HYDROCODONE-ACETAMINOPHEN 5-325 MG PO TABS: 1.0000 | ORAL_TABLET | Freq: Four times a day (QID) | ORAL | 0 refills | Status: DC | PRN

## 2018-11-17 MED ORDER — HYDROCODONE-ACETAMINOPHEN 5-325 MG PO TABS
1.0000 | ORAL_TABLET | Freq: Once | ORAL | Status: AC
  Administered 2018-11-17: 23:00:00 1 via ORAL
  Filled 2018-11-17: qty 1
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
## 2018-11-17 NOTE — Discharge Instructions (Signed)
You have a fracture of your orbital floor (the bone surrounding your eye) on the right side. Do not blow your nose as this will cause pain and swelling in your face. Please follow-up with the ENT physician, call for appointment.
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
## 2018-11-26 NOTE — Progress Notes (Signed)
[id number] - [place], [place] - [address] [place]  [address] Phone: [phone] Fax: [phone]      Your procedure is scheduled on November 20  Report to [facility] Main Entrance "A" at 1100 A.M., and check in at the Admitting office.  Call this number if you have problems the morning of surgery:  [phone]  Call [phone] if you have any questions prior to your surgery date Monday-Friday 8am-4pm    Remember:  Do not eat or drink after midnight the night before your surgery     Take these medicines the morning of surgery with A SIP OF WATER  albuterol (VENTOLIN HFA) FLUoxetine (PROZAC) hydrOXYzine (VISTARIL)   7 days prior to surgery STOP taking any Aspirin (unless otherwise instructed by your surgeon), Aleve, Naproxen, Ibuprofen, Motrin, Advil, Goody's, BC's, all herbal medications, fish oil, and all vitamins.    The Morning of Surgery  Do not wear jewelry, make-up or nail polish.  Do not wear lotions, powders, or perfumes, or deodorant  Do not shave 48 hours prior to surgery.    Do not bring valuables to the hospital.  [facility] is not responsible for any belongings or valuables.  If you are a smoker, DO NOT Smoke 24 hours prior to surgery  If you wear a CPAP at night please bring your mask, tubing, and machine the morning of surgery   Remember that you must have someone to transport you home after your surgery, and remain with you for 24 hours if you are discharged the same day.   Please bring cases for contacts, glasses, hearing aids, dentures or bridgework because it cannot be worn into surgery.    Leave your suitcase in the car.  After surgery it may be brought to your room.  For patients admitted to the hospital, discharge time will be determined by your treatment team.  Patients discharged the day of surgery will not be allowed to drive home.    Special instructions:   [facility]- Preparing For Surgery  Before surgery, you can play an important role. Because skin is not sterile, your skin needs to be as free of germs as possible. You can reduce the number of germs on your skin by washing with CHG (chlorahexidine gluconate) Soap before surgery.  CHG is an antiseptic cleaner which kills germs and bonds with the skin to continue killing germs even after washing.    Oral Hygiene is also important to reduce your risk of infection.  Remember - BRUSH YOUR TEETH THE MORNING OF SURGERY WITH YOUR REGULAR TOOTHPASTE  Please do not use if you have an allergy to CHG or antibacterial soaps. If your skin becomes reddened/irritated stop using the CHG.  Do not shave (including legs and underarms) for at least 48 hours prior to first CHG shower. It is OK to shave your face.  Please follow these instructions carefully.   1. Shower the NIGHT BEFORE SURGERY and the MORNING OF SURGERY with CHG Soap.   2. If you chose to wash your hair, wash your hair first as usual with your normal shampoo.  3. After you shampoo, rinse your hair and body thoroughly to remove the shampoo.  4. Use CHG as you would any other liquid soap. You can apply CHG directly to the skin and wash gently with a scrungie or a clean washcloth.   5. Apply the CHG Soap to your body ONLY FROM  THE NECK DOWN.  Do not use on open wounds or open sores. Avoid contact with your eyes, ears, mouth and genitals (private parts). Wash Face and genitals (private parts)  with your normal soap.   6. Wash thoroughly, paying special attention to the area where your surgery will be performed.  7. Thoroughly rinse your body with warm water from the neck down.  8. DO NOT shower/wash with your normal soap after using and rinsing off the CHG Soap.  9. Pat yourself dry with a CLEAN TOWEL.  10. Wear CLEAN PAJAMAS to bed the night before surgery, wear comfortable clothes the morning of surgery  11. Place CLEAN SHEETS on your bed the  night of your first shower and DO NOT SLEEP WITH PETS.    Day of Surgery:  Please shower the morning of surgery with the CHG soap Do not apply any deodorants/lotions. Please wear clean clothes to the hospital/surgery center.   Remember to brush your teeth WITH YOUR REGULAR TOOTHPASTE.   Please read over the following fact sheets that you were given.

## 2018-11-27 ENCOUNTER — Encounter (HOSPITAL_COMMUNITY)
Admission: RE | Admit: 2018-11-27 | Discharge: 2018-11-27 | Disposition: A | Discharge status: Home / Self Care | Payer: BC Managed Care – PPO | Source: Ambulatory Visit | Attending: Otolaryngology | Admitting: Otolaryngology

## 2018-11-27 ENCOUNTER — Other Ambulatory Visit (HOSPITAL_COMMUNITY)
Admission: RE | Admit: 2018-11-27 | Discharge: 2018-11-27 | Disposition: A | Discharge status: Home / Self Care | Payer: BC Managed Care – PPO | Source: Ambulatory Visit | Attending: Otolaryngology | Admitting: Otolaryngology

## 2018-11-27 ENCOUNTER — Other Ambulatory Visit: Payer: Self-pay | Admitting: Otolaryngology

## 2018-11-27 ENCOUNTER — Encounter (HOSPITAL_COMMUNITY): Payer: Self-pay

## 2018-11-27 ENCOUNTER — Other Ambulatory Visit: Payer: Self-pay

## 2018-11-27 DIAGNOSIS — Z01812 Encounter for preprocedural laboratory examination: Secondary | ICD-10-CM | POA: Insufficient documentation

## 2018-11-27 LAB — CBC
HCT: 37 % (ref 36.0–46.0)
Hemoglobin: 11.6 g/dL — ABNORMAL LOW (ref 12.0–15.0)
MCH: 26.7 pg (ref 26.0–34.0)
MCHC: 31.4 g/dL (ref 30.0–36.0)
MCV: 85.1 fL (ref 80.0–100.0)
Platelets: 269 10*3/uL (ref 150–400)
RBC: 4.35 MIL/uL (ref 3.87–5.11)
RDW: 15.2 % (ref 11.5–15.5)
WBC: 8.2 10*3/uL (ref 4.0–10.5)
nRBC: 0 % (ref 0.0–0.2)

## 2018-11-27 LAB — COMPREHENSIVE METABOLIC PANEL
ALT: 18 U/L (ref 0–44)
AST: 18 U/L (ref 15–41)
Albumin: 3.6 g/dL (ref 3.5–5.0)
Alkaline Phosphatase: 63 U/L (ref 38–126)
Anion gap: 8 (ref 5–15)
BUN: 8 mg/dL (ref 6–20)
CO2: 22 mmol/L (ref 22–32)
Calcium: 8.8 mg/dL — ABNORMAL LOW (ref 8.9–10.3)
Chloride: 108 mmol/L (ref 98–111)
Creatinine, Ser: 0.8 mg/dL (ref 0.44–1.00)
GFR calc Af Amer: >60 mL/min (ref >60)
GFR calc non Af Amer: >60 mL/min (ref >60)
Glucose, Bld: 105 mg/dL — ABNORMAL HIGH (ref 70–99)
Potassium: 3.4 mmol/L — ABNORMAL LOW (ref 3.5–5.1)
Sodium: 138 mmol/L (ref 135–145)
Total Bilirubin: 0.4 mg/dL (ref 0.3–1.2)
Total Protein: 6.7 g/dL (ref 6.5–8.1)

## 2018-11-27 LAB — SARS CORONAVIRUS 2 (TAT 6-24 HRS): SARS Coronavirus 2: NEGATIVE

## 2018-11-27 NOTE — Progress Notes (Signed)
PCP - denies Cardiologist -denies   Chest x-ray - N/A EKG - N/A Stress Test - denies  ECHO - denies Cardiac Cath - denies  Blood Thinner Instructions: N/A Aspirin Instructions: N/A  COVID TEST- scheduled after PAT today 11/27/18; patient is instructed in guidelines for self-quarantine; states verbal understanding  Coronavirus Screening  Have you experienced the following symptoms:  Cough yes/no: No Fever (>100.32F)  yes/no: No Runny nose yes/no: No Sore throat yes/no: No Difficulty breathing/shortness of breath  yes/no: No  Have you or a family member traveled in the last 14 days and where? yes/no: No  If the patient indicates "YES" to the above questions, their PAT will be rescheduled to limit the exposure to others and, the surgeon will be notified. THE PATIENT WILL NEED TO BE ASYMPTOMATIC FOR 14 DAYS.   If the patient is not experiencing any of these symptoms, the PAT nurse will instruct them to NOT bring anyone with them to their appointment since they may have these symptoms or traveled as well.   Please remind your patients and families that hospital visitation restrictions are in effect and the importance of the restrictions.   Anesthesia review: No  Patient denies shortness of breath, fever, cough and chest pain at PAT appointment   All instructions explained to the patient, with a verbal understanding of the material. Patient agrees to go over the instructions while at home for a better understanding. Patient also instructed to self quarantine after being tested for COVID-19. The opportunity to ask questions was provided.

## 2018-11-27 NOTE — Progress Notes (Signed)
Message left w/Dr. Redmond Baseman' office requesting orders for procedure scheduled 11/29/18.

## 2018-11-29 ENCOUNTER — Ambulatory Visit (HOSPITAL_COMMUNITY): Payer: BC Managed Care – PPO | Admitting: Anesthesiology

## 2018-11-29 ENCOUNTER — Encounter (HOSPITAL_COMMUNITY)
Admission: RE | Disposition: A | Discharge status: Home / Self Care | Payer: Self-pay | Source: Home / Self Care | Attending: Otolaryngology

## 2018-11-29 ENCOUNTER — Ambulatory Visit (HOSPITAL_COMMUNITY)
Admission: RE | Admit: 2018-11-29 | Discharge: 2018-11-29 | Disposition: A | Discharge status: Home / Self Care | Payer: BC Managed Care – PPO | Attending: Otolaryngology | Admitting: Otolaryngology

## 2018-11-29 ENCOUNTER — Encounter (HOSPITAL_COMMUNITY): Payer: Self-pay

## 2018-11-29 DIAGNOSIS — J45909 Unspecified asthma, uncomplicated: Secondary | ICD-10-CM | POA: Diagnosis not present

## 2018-11-29 DIAGNOSIS — F329 Major depressive disorder, single episode, unspecified: Secondary | ICD-10-CM | POA: Insufficient documentation

## 2018-11-29 DIAGNOSIS — F419 Anxiety disorder, unspecified: Secondary | ICD-10-CM | POA: Diagnosis not present

## 2018-11-29 DIAGNOSIS — S0231XA Fracture of orbital floor, right side, initial encounter for closed fracture: Secondary | ICD-10-CM | POA: Insufficient documentation

## 2018-11-29 DIAGNOSIS — Z79899 Other long term (current) drug therapy: Secondary | ICD-10-CM | POA: Diagnosis not present

## 2018-11-29 HISTORY — PX: NASAL SINUS SURGERY: SHX719

## 2018-11-29 LAB — POCT PREGNANCY, URINE: Preg Test, Ur: NEGATIVE

## 2018-11-29 SURGERY — SINUS SURGERY, ENDOSCOPIC
Anesthesia: General | Site: Mouth | Laterality: Right

## 2018-11-29 MED ORDER — MUPIROCIN CALCIUM 2 % EX CREA
TOPICAL_CREAM | CUTANEOUS | Status: AC
  Filled 2018-11-29: qty 15

## 2018-11-29 MED ORDER — CEFAZOLIN SODIUM 1 G IJ SOLR
INTRAMUSCULAR | Status: AC
  Filled 2018-11-29: qty 20

## 2018-11-29 MED ORDER — PROPOFOL 10 MG/ML IV BOLUS
INTRAVENOUS | Status: DC | PRN
  Administered 2018-11-29: 200 mg via INTRAVENOUS

## 2018-11-29 MED ORDER — SUGAMMADEX SODIUM 200 MG/2ML IV SOLN
INTRAVENOUS | Status: DC | PRN
  Administered 2018-11-29: 180 mg via INTRAVENOUS

## 2018-11-29 MED ORDER — MIDAZOLAM HCL 5 MG/5ML IJ SOLN
INTRAMUSCULAR | Status: DC | PRN
  Administered 2018-11-29: 2 mg via INTRAVENOUS

## 2018-11-29 MED ORDER — CEFAZOLIN SODIUM-DEXTROSE 2-3 GM-%(50ML) IV SOLR
INTRAVENOUS | Status: DC | PRN
  Administered 2018-11-29: 2 g via INTRAVENOUS

## 2018-11-29 MED ORDER — MIDAZOLAM HCL 2 MG/2ML IJ SOLN
INTRAMUSCULAR | Status: AC
  Filled 2018-11-29: qty 2

## 2018-11-29 MED ORDER — OXYMETAZOLINE HCL 0.05 % NA SOLN
NASAL | Status: AC
  Filled 2018-11-29: qty 30

## 2018-11-29 MED ORDER — ROCURONIUM BROMIDE 10 MG/ML (PF) SYRINGE
PREFILLED_SYRINGE | INTRAVENOUS | Status: AC
  Filled 2018-11-29: qty 10

## 2018-11-29 MED ORDER — DEXAMETHASONE SODIUM PHOSPHATE 10 MG/ML IJ SOLN
INTRAMUSCULAR | Status: DC | PRN
  Administered 2018-11-29: 4 mg via INTRAVENOUS

## 2018-11-29 MED ORDER — ACETAMINOPHEN 325 MG PO TABS
650.0000 mg | ORAL_TABLET | Freq: Once | ORAL | Status: AC
  Administered 2018-11-29: 650 mg via ORAL

## 2018-11-29 MED ORDER — FENTANYL CITRATE (PF) 250 MCG/5ML IJ SOLN
INTRAMUSCULAR | Status: AC
  Filled 2018-11-29: qty 5

## 2018-11-29 MED ORDER — LACTATED RINGERS IV SOLN
INTRAVENOUS | Status: DC
  Administered 2018-11-29 (×2): via INTRAVENOUS

## 2018-11-29 MED ORDER — ROCURONIUM BROMIDE 10 MG/ML (PF) SYRINGE
PREFILLED_SYRINGE | INTRAVENOUS | Status: DC | PRN
  Administered 2018-11-29: 60 mg via INTRAVENOUS
  Administered 2018-11-29: 20 mg via INTRAVENOUS
  Administered 2018-11-29 (×2): 10 mg via INTRAVENOUS

## 2018-11-29 MED ORDER — LIDOCAINE 2% (20 MG/ML) 5 ML SYRINGE
INTRAMUSCULAR | Status: DC | PRN
  Administered 2018-11-29: 100 mg via INTRAVENOUS

## 2018-11-29 MED ORDER — LIDOCAINE-EPINEPHRINE 1 %-1:100000 IJ SOLN
INTRAMUSCULAR | Status: DC | PRN
  Administered 2018-11-29: 20 mL

## 2018-11-29 MED ORDER — ONDANSETRON HCL 4 MG/2ML IJ SOLN
INTRAMUSCULAR | Status: DC | PRN
  Administered 2018-11-29: 4 mg via INTRAVENOUS

## 2018-11-29 MED ORDER — LIDOCAINE 2% (20 MG/ML) 5 ML SYRINGE
INTRAMUSCULAR | Status: AC
  Filled 2018-11-29: qty 5

## 2018-11-29 MED ORDER — 0.9 % SODIUM CHLORIDE (POUR BTL) OPTIME
TOPICAL | Status: DC | PRN
  Administered 2018-11-29 (×2): 1000 mL

## 2018-11-29 MED ORDER — FENTANYL CITRATE (PF) 250 MCG/5ML IJ SOLN
INTRAMUSCULAR | Status: DC | PRN
  Administered 2018-11-29 (×5): 50 ug via INTRAVENOUS
  Administered 2018-11-29: 100 ug via INTRAVENOUS
  Administered 2018-11-29: 50 ug via INTRAVENOUS

## 2018-11-29 MED ORDER — ONDANSETRON HCL 4 MG/2ML IJ SOLN
INTRAMUSCULAR | Status: AC
  Filled 2018-11-29: qty 2

## 2018-11-29 MED ORDER — DEXAMETHASONE SODIUM PHOSPHATE 10 MG/ML IJ SOLN
INTRAMUSCULAR | Status: AC
  Filled 2018-11-29: qty 1

## 2018-11-29 MED ORDER — ACETAMINOPHEN 325 MG PO TABS
ORAL_TABLET | ORAL | Status: AC
  Filled 2018-11-29: qty 2

## 2018-11-29 MED ORDER — PROPOFOL 10 MG/ML IV BOLUS
INTRAVENOUS | Status: AC
  Filled 2018-11-29: qty 20

## 2018-11-29 SURGICAL SUPPLY — 51 items
BLADE RAD40 ROTATE 4M 4 5PK (BLADE) IMPLANT
BLADE RAD60 ROTATE M4 4 5PK (BLADE) IMPLANT
BLADE TRICUT ROTATE M4 4 5PK (BLADE) ×2 IMPLANT
CANISTER SUC SOCK COL 7 IN (MISCELLANEOUS) IMPLANT
CANISTER SUCT 3000ML PPV (MISCELLANEOUS) ×2 IMPLANT
COAGULATOR SUCT SWTCH 10FR 6 (ELECTROSURGICAL) IMPLANT
COVER LIGHT HANDLE STERIS (MISCELLANEOUS) IMPLANT
COVER WAND RF STERILE (DRAPES) ×2 IMPLANT
DRAPE HALF SHEET 40X57 (DRAPES) IMPLANT
DRESSING MEROCEL 8CM (GAUZE/BANDAGES/DRESSINGS) IMPLANT
DRESSING NASAL POPE 10X1.5X2.5 (GAUZE/BANDAGES/DRESSINGS) IMPLANT
DRSG MEROCEL 8CM (GAUZE/BANDAGES/DRESSINGS)
DRSG NASAL POPE 10X1.5X2.5 (GAUZE/BANDAGES/DRESSINGS)
DRSG NASOPORE 8CM (GAUZE/BANDAGES/DRESSINGS) IMPLANT
DRSG TELFA 3X8 NADH (GAUZE/BANDAGES/DRESSINGS) IMPLANT
ELECT COATED BLADE 2.86 ST (ELECTRODE) ×1 IMPLANT
ELECT PENCIL ROCKER SW 15FT (MISCELLANEOUS) ×1 IMPLANT
ELECT REM PT RETURN 9FT ADLT (ELECTROSURGICAL)
ELECTRODE REM PT RTRN 9FT ADLT (ELECTROSURGICAL) IMPLANT
GLOVE BIO SURGEON STRL SZ7.5 (GLOVE) ×2 IMPLANT
GOWN STRL REUS W/ TWL LRG LVL3 (GOWN DISPOSABLE) ×2 IMPLANT
GOWN STRL REUS W/TWL LRG LVL3 (GOWN DISPOSABLE) ×2
KIT BASIN OR (CUSTOM PROCEDURE TRAY) ×2 IMPLANT
KIT TURNOVER KIT B (KITS) ×2 IMPLANT
NDL HYPO 25GX1X1/2 BEV (NEEDLE) IMPLANT
NDL PRECISIONGLIDE 27X1.5 (NEEDLE) ×1 IMPLANT
NDL SPNL 22GX3.5 QUINCKE BK (NEEDLE) ×1 IMPLANT
NDL SPNL 25GX3.5 QUINCKE BL (NEEDLE) IMPLANT
NEEDLE HYPO 25GX1X1/2 BEV (NEEDLE) IMPLANT
NEEDLE PRECISIONGLIDE 27X1.5 (NEEDLE) ×2 IMPLANT
NEEDLE SPNL 22GX3.5 QUINCKE BK (NEEDLE) ×2 IMPLANT
NEEDLE SPNL 25GX3.5 QUINCKE BL (NEEDLE) IMPLANT
NS IRRIG 1000ML POUR BTL (IV SOLUTION) ×2 IMPLANT
PAD ARMBOARD 7.5X6 YLW CONV (MISCELLANEOUS) ×4 IMPLANT
PAD DRESSING TELFA 3X8 NADH (GAUZE/BANDAGES/DRESSINGS) IMPLANT
PATTIES SURGICAL .5 X3 (DISPOSABLE) ×2 IMPLANT
PLATE TRI DELTA 1.7X30 (Plate) ×1 IMPLANT
POSITIONER HEAD DONUT 9IN (MISCELLANEOUS) IMPLANT
SHEATH ENDOSCRUB 0 DEG (SHEATH) ×2 IMPLANT
SHEATH ENDOSCRUB 30 DEG (SHEATH) ×2 IMPLANT
SHEATH ENDOSCRUB 45 DEG (SHEATH) IMPLANT
SOL ANTI FOG 6CC (MISCELLANEOUS) ×1 IMPLANT
SOLUTION ANTI FOG 6CC (MISCELLANEOUS) ×1
SUT ETHILON 3 0 PS 1 (SUTURE) IMPLANT
SUT MNCRL AB 3-0 PS2 18 (SUTURE) ×1 IMPLANT
SWAB COLLECTION DEVICE MRSA (MISCELLANEOUS) IMPLANT
SYR 50ML SLIP (SYRINGE) IMPLANT
TOWEL GREEN STERILE FF (TOWEL DISPOSABLE) ×2 IMPLANT
TRAY ENT MC OR (CUSTOM PROCEDURE TRAY) ×2 IMPLANT
TUBE CONNECTING 12X1/4 (SUCTIONS) ×2 IMPLANT
WATER STERILE IRR 1000ML POUR (IV SOLUTION) ×2 IMPLANT

## 2018-11-29 NOTE — Anesthesia Postprocedure Evaluation (Signed)
Anesthesia Post Note  Patient: Linda Frank  Procedure(s) Performed: RIGHT ENDOSCOPIC ORBITAL FLOOR REPAIR (Right Mouth)     Patient location during evaluation: PACU Anesthesia Type: General Level of consciousness: awake and alert Pain management: pain level controlled Vital Signs Assessment: post-procedure vital signs reviewed and stable Respiratory status: spontaneous breathing, nonlabored ventilation and respiratory function stable Cardiovascular status: blood pressure returned to baseline and stable Postop Assessment: no apparent nausea or vomiting Anesthetic complications: no    Last Vitals:  Vitals:   11/29/18 1320 11/29/18 1335  BP: (!) 144/96 (!) 144/96  Pulse: 63 66  Resp: 20 17  Temp:    SpO2: 97% 97%    Last Pain:  Vitals:   11/29/18 0923  TempSrc: Oral                 Lidia Collum

## 2018-11-29 NOTE — Anesthesia Procedure Notes (Signed)
Procedure Name: Intubation Date/Time: 11/29/2018 11:18 AM Performed by: Amadeo Garnet, CRNA Pre-anesthesia Checklist: Patient identified, Emergency Drugs available, Suction available, Timeout performed and Patient being monitored Patient Re-evaluated:Patient Re-evaluated prior to induction Oxygen Delivery Method: Circle system utilized Preoxygenation: Pre-oxygenation with 100% oxygen Induction Type: IV induction Ventilation: Mask ventilation without difficulty Laryngoscope Size: Mac and 3 Grade View: Grade I Tube type: Oral Tube size: 7.0 mm Number of attempts: 1 Airway Equipment and Method: Stylet Placement Confirmation: ETT inserted through vocal cords under direct vision,  positive ETCO2 and breath sounds checked- equal and bilateral Secured at: 22 cm Tube secured with: Tape Dental Injury: Teeth and Oropharynx as per pre-operative assessment

## 2018-11-29 NOTE — Brief Op Note (Signed)
11/29/2018  12:21 PM  PATIENT:  Eldred Manges  33 y.o. female  PRE-OPERATIVE DIAGNOSIS:  RIGHT ORBITAL FLOOR FRACTURE  POST-OPERATIVE DIAGNOSIS:  RIGHT ORBITAL FLOOR FRACTURE  PROCEDURE:  Procedure(s): RIGHT ENDOSCOPIC ORBITAL FLOOR REPAIR (Right) Transantral  SURGEON:  Surgeon(s) and Role:    * Melida Quitter, MD - Primary  PHYSICIAN ASSISTANT:   ASSISTANTS: none   ANESTHESIA:   general  EBL: Minimal  BLOOD ADMINISTERED:none  DRAINS: none   LOCAL MEDICATIONS USED:  LIDOCAINE   SPECIMEN:  No Specimen  DISPOSITION OF SPECIMEN:  N/A  COUNTS:  YES  TOURNIQUET:  * No tourniquets in log *  DICTATION: .Other Dictation: Dictation Number [id number]  PLAN OF CARE: Discharge to home after PACU  PATIENT DISPOSITION:  PACU - hemodynamically stable.   Delay start of Pharmacological VTE agent (>24hrs) due to surgical blood loss or risk of bleeding: no
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
## 2018-11-29 NOTE — Transfer of Care (Signed)
Immediate Anesthesia Transfer of Care Note  Patient: Linda Frank  Procedure(s) Performed: RIGHT ENDOSCOPIC ORBITAL FLOOR REPAIR (Right Mouth)  Patient Location: PACU  Anesthesia Type:General  Level of Consciousness: awake, alert  and oriented  Airway & Oxygen Therapy: Patient Spontanous Breathing and Patient connected to face mask oxygen  Post-op Assessment: Report given to RN, Post -op Vital signs reviewed and stable and Patient moving all extremities  Post vital signs: Reviewed and stable  Last Vitals:  Vitals Value Taken Time  BP 171/98 11/29/18 1250  Temp 37.1 C 11/29/18 1250  Pulse    Resp 10 11/29/18 1300  SpO2 100 % 11/29/18 1250  Vitals shown include unvalidated device data.  Last Pain:  Vitals:   11/29/18 0923  TempSrc: Oral         Complications: No apparent anesthesia complications

## 2018-11-29 NOTE — Anesthesia Preprocedure Evaluation (Signed)
Anesthesia Evaluation  Patient identified by MRN, date of birth, ID band Patient awake    Reviewed: Allergy & Precautions, NPO status , Patient's Chart, lab work & pertinent test results  History of Anesthesia Complications Negative for: history of anesthetic complications  Airway Mallampati: I  TM Distance: >3 FB Neck ROM: Full    Dental  (+) Teeth Intact   Pulmonary asthma , Current Smoker,    Pulmonary exam normal        Cardiovascular negative cardio ROS Normal cardiovascular exam     Neuro/Psych PSYCHIATRIC DISORDERS Anxiety Depression negative neurological ROS     GI/Hepatic negative GI ROS, (+)     substance abuse  alcohol use and marijuana use,   Endo/Other  negative endocrine ROS  Renal/GU negative Renal ROS  negative genitourinary   Musculoskeletal negative musculoskeletal ROS (+)   Abdominal   Peds  Hematology negative hematology ROS (+)   Anesthesia Other Findings   Reproductive/Obstetrics                            Anesthesia Physical Anesthesia Plan  ASA: I  Anesthesia Plan: General   Post-op Pain Management:    Induction: Intravenous  PONV Risk Score and Plan: 2 and Ondansetron, Dexamethasone, Treatment may vary due to age or medical condition and Midazolam  Airway Management Planned: Oral ETT  Additional Equipment: None  Intra-op Plan:   Post-operative Plan: Extubation in OR  Informed Consent: I have reviewed the patients History and Physical, chart, labs and discussed the procedure including the risks, benefits and alternatives for the proposed anesthesia with the patient or authorized representative who has indicated his/her understanding and acceptance.     Dental advisory given  Plan Discussed with:   Anesthesia Plan Comments:         Anesthesia Quick Evaluation

## 2018-12-02 ENCOUNTER — Encounter (HOSPITAL_COMMUNITY): Payer: Self-pay | Admitting: Otolaryngology

## 2018-12-09 NOTE — Addendum Note (Signed)
Addendum  created 12/09/18 0701 by Lidia Collum, MD   Intraprocedure Staff edited
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
## 2022-04-24 ENCOUNTER — Encounter: Payer: Self-pay | Admitting: *Deleted

## 2023-05-21 ENCOUNTER — Encounter (HOSPITAL_BASED_OUTPATIENT_CLINIC_OR_DEPARTMENT_OTHER): Payer: Self-pay

## 2023-05-21 ENCOUNTER — Other Ambulatory Visit: Payer: Self-pay

## 2023-05-21 ENCOUNTER — Emergency Department (HOSPITAL_BASED_OUTPATIENT_CLINIC_OR_DEPARTMENT_OTHER)

## 2023-05-21 ENCOUNTER — Emergency Department (HOSPITAL_BASED_OUTPATIENT_CLINIC_OR_DEPARTMENT_OTHER): Admission: EM | Admit: 2023-05-21 | Discharge: 2023-05-21 | Disposition: A | Discharge status: Home / Self Care

## 2023-05-21 DIAGNOSIS — R202 Paresthesia of skin: Secondary | ICD-10-CM | POA: Insufficient documentation

## 2023-05-21 DIAGNOSIS — M542 Cervicalgia: Secondary | ICD-10-CM | POA: Diagnosis present

## 2023-05-21 DIAGNOSIS — Y92410 Unspecified street and highway as the place of occurrence of the external cause: Secondary | ICD-10-CM | POA: Insufficient documentation

## 2023-05-21 MED ORDER — METHOCARBAMOL 500 MG PO TABS: 1000.0000 mg | ORAL_TABLET | Freq: Three times a day (TID) | ORAL | 0 refills | Status: AC | PRN

## 2023-05-21 MED ORDER — PREDNISONE 20 MG PO TABS: ORAL_TABLET | ORAL | 0 refills | Status: AC

## 2023-05-21 NOTE — ED Notes (Signed)
 Patient transported to CT
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
## 2024-01-31 ENCOUNTER — Other Ambulatory Visit: Payer: Self-pay

## 2024-01-31 ENCOUNTER — Emergency Department (HOSPITAL_BASED_OUTPATIENT_CLINIC_OR_DEPARTMENT_OTHER)
Admission: EM | Admit: 2024-01-31 | Discharge: 2024-01-31 | Disposition: A | Discharge status: Home / Self Care | Source: Ambulatory Visit | Attending: Emergency Medicine | Admitting: Emergency Medicine

## 2024-01-31 ENCOUNTER — Encounter (HOSPITAL_BASED_OUTPATIENT_CLINIC_OR_DEPARTMENT_OTHER): Payer: Self-pay

## 2024-01-31 ENCOUNTER — Emergency Department (HOSPITAL_BASED_OUTPATIENT_CLINIC_OR_DEPARTMENT_OTHER)

## 2024-01-31 DIAGNOSIS — R220 Localized swelling, mass and lump, head: Secondary | ICD-10-CM | POA: Diagnosis present

## 2024-01-31 DIAGNOSIS — K0889 Other specified disorders of teeth and supporting structures: Secondary | ICD-10-CM | POA: Insufficient documentation

## 2024-01-31 DIAGNOSIS — L03211 Cellulitis of face: Secondary | ICD-10-CM

## 2024-01-31 LAB — CBC WITH DIFFERENTIAL/PLATELET
Abs Immature Granulocytes: 0.02 K/uL (ref 0.00–0.07)
Basophils Absolute: 0 K/uL (ref 0.0–0.1)
Basophils Relative: 0 %
Eosinophils Absolute: 0.2 K/uL (ref 0.0–0.5)
Eosinophils Relative: 2 %
HCT: 33.1 % — ABNORMAL LOW (ref 36.0–46.0)
Hemoglobin: 10.7 g/dL — ABNORMAL LOW (ref 12.0–15.0)
Immature Granulocytes: 0 %
Lymphocytes Relative: 38 %
Lymphs Abs: 3.3 K/uL (ref 0.7–4.0)
MCH: 25.7 pg — ABNORMAL LOW (ref 26.0–34.0)
MCHC: 32.3 g/dL (ref 30.0–36.0)
MCV: 79.6 fL — ABNORMAL LOW (ref 80.0–100.0)
Monocytes Absolute: 0.6 K/uL (ref 0.1–1.0)
Monocytes Relative: 6 %
Neutro Abs: 4.5 K/uL (ref 1.7–7.7)
Neutrophils Relative %: 54 %
Platelets: 277 K/uL (ref 150–400)
RBC: 4.16 MIL/uL (ref 3.87–5.11)
RDW: 15.2 % (ref 11.5–15.5)
WBC: 8.6 K/uL (ref 4.0–10.5)
nRBC: 0 % (ref 0.0–0.2)

## 2024-01-31 LAB — BASIC METABOLIC PANEL WITH GFR
Anion gap: 10 (ref 5–15)
BUN: 13 mg/dL (ref 6–20)
CO2: 21 mmol/L — ABNORMAL LOW (ref 22–32)
Calcium: 8.6 mg/dL — ABNORMAL LOW (ref 8.9–10.3)
Chloride: 108 mmol/L (ref 98–111)
Creatinine, Ser: 0.77 mg/dL (ref 0.44–1.00)
GFR, Estimated: >60 mL/min
Glucose, Bld: 82 mg/dL (ref 70–99)
Potassium: 3.7 mmol/L (ref 3.5–5.1)
Sodium: 139 mmol/L (ref 135–145)

## 2024-01-31 LAB — HCG, SERUM, QUALITATIVE: Preg, Serum: NEGATIVE

## 2024-01-31 MED ORDER — CLINDAMYCIN HCL 150 MG PO CAPS
300.0000 mg | ORAL_CAPSULE | Freq: Once | ORAL | Status: AC
  Administered 2024-01-31: 300 mg via ORAL
  Filled 2024-01-31: qty 2

## 2024-01-31 MED ORDER — AMOXICILLIN-POT CLAVULANATE 875-125 MG PO TABS: 1.0000 | ORAL_TABLET | Freq: Two times a day (BID) | ORAL | 0 refills | Status: DC

## 2024-01-31 MED ORDER — CLINDAMYCIN HCL 300 MG PO CAPS: 300.0000 mg | ORAL_CAPSULE | Freq: Three times a day (TID) | ORAL | 0 refills | Status: AC

## 2024-01-31 MED ORDER — IOHEXOL 300 MG/ML  SOLN
80.0000 mL | Freq: Once | INTRAMUSCULAR | Status: AC | PRN
  Administered 2024-01-31: 80 mL via INTRAVENOUS

## 2024-01-31 NOTE — Discharge Instructions (Addendum)
 Please follow-up with your dentist as soon as possible for further management of your jaw swelling and pain.  You may take up to 1000mg  of tylenol  every 6 hours as needed for pain.  Do not take more then 4g per day.  You may use up to 600mg  ibuprofen every 6 hours as needed for pain.  Do not exceed 2.4g of ibuprofen per day.  We have prescribed you clindamycin , an antibiotic.  Take 3 times daily.  May also use warm compress to your face  Please return to the ER for any unexplained fevers, difficulty swallowing, any other new or concerning symptoms

## 2024-01-31 NOTE — ED Provider Notes (Signed)
 " [place] Provider Note   CSN: [id number] Arrival date & time: 01/31/24  1705     Patient presents with: Oral Swelling   Linda Frank is a 39 y.o. female  with no significant past medical history presents with concern for right upper jaw swelling that began after a root canal procedure performed 1 week ago.  She states she currently does not have any pain associated with her jaw swelling.  She reports she had a follow-up appointment with her dentist this morning and they took an x-ray and saw something abnormal in the bone of her jaw and they wanted her to be evaluated in the ER.  Before she had the root canal procedure, she was having right sided upper molar/jaw pain which did seem to improve after the procedure.  She denies any fever or chills at home.  No difficulties with swallowing.  She is not on any antibiotics currently.   HPI     Prior to Admission medications  Medication Sig Start Date End Date Taking? Authorizing Provider  albuterol (VENTOLIN HFA) 108 (90 Base) MCG/ACT inhaler Inhale 2 puffs into the lungs every 6 (six) hours as needed for wheezing or shortness of breath.  12/26/17   [provider]  FLUoxetine (PROZAC) 40 MG capsule Take 40 mg by mouth daily at 2 am.     [provider]  hydrOXYzine (VISTARIL) 50 MG capsule Take 50 mg by mouth 3 (three) times daily as needed for anxiety.     [provider]  ibuprofen (ADVIL) 800 MG tablet Take 800 mg by mouth at bedtime.    [provider]  methocarbamol  (ROBAXIN ) 500 MG tablet Take 2 tablets (1,000 mg total) by mouth every 8 (eight) hours as needed for muscle spasms. 05/21/23   Geiple, Joshua, PA-C  predniSONE  (DELTASONE ) 20 MG tablet 3 Tabs PO Days 1-3, then 2 tabs PO Days 4-6, then 1 tab PO Day 7-9, then Half Tab PO Day 10-12 05/21/23   Desiderio Chew, PA-C  pseudoephedrine-guaifenesin (MUCINEX D) 60-600 MG 12 hr tablet Take 1 tablet by  mouth daily at 2 am.    [provider]    Allergies: Patient has no known allergies.    Review of Systems  HENT:  Negative for dental problem.        Right jaw swelling    Updated Vital Signs BP (!) 132/91 (BP Location: Right Arm)   Pulse 88   Temp 97.7 F (36.5 C)   Resp 18   LMP 01/10/2024   SpO2 100%   Physical Exam Vitals and nursing note reviewed.  Constitutional:      Appearance: Normal appearance.  HENT:     Head: Atraumatic.     Mouth/Throat:     Comments: Mild soft tissue edema overlying the right upper jaw.  No obvious dental abscess.  No erythema or edema of the skin of the face.  Able to open mouth fully, no trismus.  Swallowing without difficulty.  No erythema or edema of the submandibular space or the neck.  Neck is soft and supple.  No tenderness over the maxilla or mandible bilaterally Pulmonary:     Effort: Pulmonary effort is normal.  Musculoskeletal:     Cervical back: Normal range of motion. No rigidity.  Neurological:     General: No focal deficit present.     Mental Status: She is alert.  Psychiatric:        Mood  and Affect: Mood normal.        Behavior: Behavior normal.     (all labs ordered are listed, but only abnormal results are displayed) Labs Reviewed  CBC WITH DIFFERENTIAL/PLATELET - Abnormal; Notable for the following components:      Result Value   Hemoglobin 10.7 (*)    HCT 33.1 (*)    MCV 79.6 (*)    MCH 25.7 (*)    All other components within normal limits  BASIC METABOLIC PANEL WITH GFR - Abnormal; Notable for the following components:   CO2 21 (*)    Calcium  8.6 (*)    All other components within normal limits  HCG, SERUM, QUALITATIVE    EKG: None  Radiology: No results found.   Procedures   Medications Ordered in the ED - No data to display                                  Medical Decision Making Amount and/or Complexity of Data Reviewed Labs: ordered. Radiology: ordered.     Differential  diagnosis includes but is not limited to dental abscess or infection, ludwig angina, osteomyelitis, peritonsillar abscess, gingivitis, dental carries, sialadenitis   ED Course:  Upon initial evaluation, patient is well appearing in no acute distress.  Patient able to open mouth fully with no trismus. Swallowing secretions without difficulty. Uvula midline. No visualized peritonsillar abscess or dental abscess. Neck is soft and supple without overlying skin change, no edema of the submandibular space or tongue, low concern for ludwig angina.  Given the right jaw swelling after her recent root canal, and reported concern by her dentist on x-ray findings earlier today, will obtain CT maxillofacial for further evaluation of this swelling to evaluate for possible infection.  Labs Ordered: I Ordered, and personally interpreted labs.  The pertinent results include:   CBC without leukocytosis.  Hemoglobin low at 10.7 BMP within normal limits Pregnancy test negative  Imaging Studies ordered: I ordered imaging studies including CT maxillofacial with contrast, pending results   Medications Given: None  Impression: Right jaw swelling  Disposition:  Care of this patient signed out to oncoming ED provider Britini Henderly, PA-C to follow up on CT scan. Anticipate patient may need antibiotics if any signs of infection on CT and close follow up with her oral surgeon/dentist. Disposition and treatment plan pending imaging results and clinical judgment of oncoming ED team.      Record Review: External records from outside source obtained and reviewed including PCP note from earlier today which stated The patient states that she then proceeded to have a root canal last week and then the swelling returned today. She went to the dentist prior to this appointment who she states completed a facial xray after discovering the infection had returned. She states that dentist was concerned for bone involvement  so she came here.      This chart was dictated using voice recognition software, Dragon. Despite the best efforts of this provider to proofread and correct errors, errors may still occur which can change documentation meaning.       Final diagnoses:  Jaw swelling    ED Discharge Orders     None          Veta Palma, DEVONNA 01/31/24 CLAIR    Doretha Folks, MD 02/02/24 1302  "

## 2024-01-31 NOTE — ED Provider Notes (Signed)
 Right upper jaw swelling after root canal last week.  Xray at dentist today. Sent for xray to r/o bony involvement.    Physical Exam  BP (!) 132/91 (BP Location: Right Arm)   Pulse 88   Temp 97.7 F (36.5 C)   Resp 18   LMP 01/10/2024   SpO2 100%   Physical Exam Vitals and nursing note reviewed.  Constitutional:      General: She is not in acute distress.    Appearance: She is well-developed. She is not ill-appearing.  HENT:     Head: Atraumatic.     Jaw: There is normal jaw occlusion.     Comments: Submandibular area soft, no induration, fluctuance.  No drooling, dysphagia or trismus.  No raccoon eye, vitals    Mouth/Throat:     Lips: Pink.     Mouth: Mucous membranes are moist.     Dentition: Abnormal dentition.     Pharynx: Oropharynx is clear. Uvula midline.     Comments: Sublingual area soft.  Tongue midline.  No obvious drainable abscess Eyes:     Pupils: Pupils are equal, round, and reactive to light.  Cardiovascular:     Rate and Rhythm: Normal rate.  Pulmonary:     Effort: No respiratory distress.  Abdominal:     General: There is no distension.  Musculoskeletal:        General: Normal range of motion.     Cervical back: Normal range of motion.  Skin:    General: Skin is warm and dry.  Neurological:     General: No focal deficit present.     Mental Status: She is alert.  Psychiatric:        Mood and Affect: Mood normal.     Procedures  Procedures Labs Reviewed  CBC WITH DIFFERENTIAL/PLATELET - Abnormal; Notable for the following components:      Result Value   Hemoglobin 10.7 (*)    HCT 33.1 (*)    MCV 79.6 (*)    MCH 25.7 (*)    All other components within normal limits  BASIC METABOLIC PANEL WITH GFR - Abnormal; Notable for the following components:   CO2 21 (*)    Calcium  8.6 (*)    All other components within normal limits  HCG, SERUM, QUALITATIVE   CT Maxillofacial W Contrast Result Date: 01/31/2024 EXAM: CT Facial Bones with contrast  01/31/2024 07:24:01 PM TECHNIQUE: CT of the facial bones was performed with the administration of intravenous contrast. Multiplanar reformatted images are provided for review. Automated exposure control, iterative reconstruction, and/or weight based adjustment of the mA/kV was utilized to reduce the radiation dose to as low as reasonably achievable. COMPARISON: None available CLINICAL HISTORY: Eval tooth right upper jaw swelling, dentist concerned of osteomyelitis on x-ray Evaluation of tooth in the right upper jaw swelling. Dentist is concerned about osteomyelitis on X-ray. FINDINGS: AERODIGESTIVE TRACT: No mass. No edema. SALIVARY GLANDS: No acute abnormality. LYMPH NODES: No suspicious cervical lymphadenopathy. TEETH AND SOFT TISSUES: Periapical lucency and buccal cortical dehiscence of the right first and second premolars. Overlying mild edema. No drainable fluid collection. BRAIN, ORBITS AND SINUSES: Mild bilateral inferior maxillary sinus mucosal thickening. No mastoid effusions. No acute intracranial findings. BONES: No acute abnormality. No suspicious bone lesion. IMPRESSION: 1. Periapical lucency and buccal cortical dehiscence of the right first and second maxillary premolars. 2. Overlying mild edema, likely cellulitis. No drainable fluid collection. Electronically signed by: Glendia Molt MD 01/31/2024 07:38 PM EST RP Workstation: [id number]  ED Course / MDM     Labs and imaging personally viewed interpreted  CT shows cellulitis, no evidence of abscess, osteomyelitis  Discussed results with patient.  Tolerating p.o. intake here.  States she has tolerated clindamycin  previously which helped with her dental infections.  Will write for this antibiotic which also cover for cellulitis.  Will have her follow-up with dentistry, PCP, return for new or worsening symptoms  The patient has been appropriately medically screened and/or stabilized in the ED. I have low suspicion for any other emergent medical  condition which would require further screening, evaluation or treatment in the ED or require inpatient management.  Patient is hemodynamically stable and in no acute distress.  Patient able to ambulate in department prior to ED.  Evaluation does not show acute pathology that would require ongoing or additional emergent interventions while in the emergency department or further inpatient treatment.  I have discussed the diagnosis with the patient and answered all questions.  Pain is been managed while in the emergency department and patient has no further complaints prior to discharge.  Patient is comfortable with plan discussed in room and is stable for discharge at this time.  I have discussed strict return precautions for returning to the emergency department.  Patient was encouraged to follow-up with PCP/specialist refer to at discharge.   Medical Decision Making Amount and/or Complexity of Data Reviewed External Data Reviewed: labs, radiology and notes. Labs: ordered. Decision-making details documented in ED Course. Radiology: ordered and independent interpretation performed. Decision-making details documented in ED Course.  Risk OTC drugs. Prescription drug management. Decision regarding hospitalization. Diagnosis or treatment significantly limited by social determinants of health.          Kael Keetch A, PA-C 01/31/24 1953    Doretha Folks, MD 02/02/24 1310

## 2024-01-31 NOTE — Progress Notes (Signed)
 [facility] Return Patient Visit Linda Frank DOB: 08-Mar-1985  AGE: 39 y.o. SEX:  female MRN: [id number] Visit Date: 01/31/2024  Encounter Provider: Lyle Vicci Gee, FNP   Chief Complaint  Patient presents with   Oral Pain    Subjective:    Ms. Gear is a 39 y.o. female that presents to clinic today regarding the following issues:  History of Present Illness The patient is a 39 year old female who presents for evaluation of jaw pain.  She reports that her dentist initially suspected the cause of her discomfort to be related to a root canal procedure. However, upon attempting to place a cap on the tooth a few days ago, it was discovered that the area was still infected. This morning, she experienced significant right sided facial swelling. This had occurred previously  when she was seen in my office 12/12/2023 where we completed a stat facial CT. She had a facial surgery due to a traumatic injury many years where an implant was put into her sinuses due to a fracture.  CT soft tissue neck w contrast 12/12/2023: Imaged brain, skull base, and face: Right maxillary sinus mucosal thickening with a focal defect along the anterior maxillary sinus wall that could be postsurgical or related to an odontogenic origin erosion. Left maxillary mucous retention cyst. Periapical lucency involving the right maxillary premolars with overlying gas, soft tissue thickening, and inflammatory stranding. Focal buccal dehiscence overlying the first premolar with a possible 4 mm subperiosteal abscess (reference series 2, image 44). Suspected recent extraction of the left mandibular second molar. Imaged lung apices: No acute findings. Bones: No destructive osseous lesions. Disc arthroplasty at C5-C6. 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 IMPRESSION: Periapical lucency  involving the right maxillary premolars with possible developing 4 mm subperiosteal abscess overlying the buccal cortex and associated regional soft tissue inflammation Due to the concerning findings of overlying gas and possible bone involvement ENT was called via the [facility] who recommended antibiotics and if it did not get better within a few hours to go to the emergency department for possible drainage. Patient had wanted to wait to go to the ER so we completed antibiotics and the swelling and pain went away. The patient states that she then proceeded to have a root canal last week and then the swelling returned today. She went to the dentist prior to this appointment who she states completed a facial xray after discovering the infection had returned. She states that dentist was concerned for bone involvement so she came here. She states that the pain is similar to her last appointment.     _____________________________________________________________________ @[facility] @ Problem List[1] Medical History[2] Surgical History[3] Family History[4] Social History   Socioeconomic History   Marital status: Single    Spouse name: Not on file   Number of children: Not on file   Years of education: Not on file   Highest education level: Not on file  Occupational History   Not on file  Tobacco Use   Smoking status: Every Day    Current packs/day: 1.00    Average packs/day: 1 pack/day for 20.1 years (20.1 ttl pk-yrs)    Types: Cigarettes    Start date: 01/10/2004   Smokeless tobacco: Never  Vaping Use   Vaping status: Never Used  Substance and Sexual Activity   Alcohol use: Yes    Alcohol/week: 28.0 standard drinks of alcohol  Comment: rarely- 3 times a month- NOT 28 STANDARD drinks   Drug use: Yes    Types: Marijuana   Sexual activity: Yes    Partners: Female    Birth control/protection: None  Other Topics Concern   Not on file  Social History Narrative   Lives with wife.  Domestic partnership.    Social Drivers of Health   Living Situation: Low Risk (08/21/2023)   Living Situation    What is your living situation today?: I have a steady place to live    Think about the place you live. Do you have problems with any of the following? Choose all that apply:: None/None on this list  Food Insecurity: Low Risk (08/21/2023)   Food vital sign    Within the past 12 months, you worried that your food would run out before you got money to buy more: Never true    Within the past 12 months, the food you bought just didn't last and you didn't have money to get more: Never true  Transportation Needs: No Transportation Needs (08/21/2023)   Transportation    In the past 12 months, has lack of reliable transportation kept you from medical appointments, meetings, work or from getting things needed for daily living? : No  Utilities: Low Risk (08/21/2023)   Utilities    In the past 12 months has the electric, gas, oil, or water company threatened to shut off services in your home? : No  Safety: Low Risk (12/14/2022)   Safety    How often does anyone, including family and friends, physically hurt you?: Never    How often does anyone, including family and friends, insult or talk down to you?: Never    How often does anyone, including family and friends, threaten you with harm?: Never    How often does anyone, including family and friends, scream or curse at you?: Never  Alcohol Screening: Not on file  Tobacco Use: High Risk (12/12/2023)   Patient History    Smoking Tobacco Use: Every Day    Smokeless Tobacco Use: Never    Passive Exposure: Not on file  Depression: Not At Risk (01/31/2024)   PHQ-2    PHQ-2 Score: 0  Recent Concern: Depression - At Risk (12/12/2023)   PHQ-2    PHQ-2 Score: 3  Social Connections: Not on file  Financial Resource Strain: Not on file   Allergies[5]  Review of Systems  Constitutional:  Negative for activity change, appetite change,  chills, diaphoresis, fatigue, fever and unexpected weight change.  HENT:  Positive for dental problem.        Right upper cheek pain and swelling.   Eyes: Negative.   Respiratory:  Negative for cough, chest tightness, shortness of breath and wheezing.   Cardiovascular:  Negative for chest pain, palpitations and leg swelling.  Gastrointestinal:  Negative for abdominal distention, abdominal pain, blood in stool, constipation, diarrhea, nausea and vomiting.  Genitourinary:  Negative for decreased urine volume, flank pain, frequency and hematuria.  Musculoskeletal: Negative.   Skin:  Negative for rash and wound.  Allergic/Immunologic: Negative.   Neurological:  Negative for dizziness, syncope, facial asymmetry, speech difficulty, weakness, light-headedness, numbness and headaches.  Hematological: Negative.   Psychiatric/Behavioral:  Negative for confusion and sleep disturbance. The patient is not nervous/anxious.       Some components of the Review of Systems, Past Medical History, Family History, Social History, and Vital Signs were taken from the patient, and documented by the medical assistant assisting  the provider today.  Objective:    Vitals:   01/31/24 1534  BP: 123/83  BP Location: Left arm  Patient Position: Sitting  Pulse: 91  Temp: 98.2 F (36.8 C)  TempSrc: Temporal  SpO2: 99%  Weight: 87.3 kg (192 lb 6.4 oz)  Height: 1.702 m (5' 7)    Patient's last menstrual period was 01/10/2024 (approximate).  BP Readings from Last 3 Encounters:  01/31/24 123/83  01/22/24 132/79  12/12/23 126/81   Wt Readings from Last 3 Encounters:  01/31/24 87.3 kg (192 lb 6.4 oz)  12/12/23 89.3 kg (196 lb 12.8 oz)  10/25/23 90.7 kg (200 lb)    Patient's Medications  New Prescriptions   No medications on file  Previous Medications   AZELASTINE (ASTELIN) 137 MCG (0.1 %) NASAL SPRAY    Administer 1-2 sprays into each nostril 2 (two) times a day as needed for rhinitis.   CLINDAMYCIN   (CLEOCIN ) 150 MG CAPSULE    Take 150 mg by mouth 2 (two) times a day.   DICLOFENAC (VOLTAREN) 75 MG EC TABLET    Take 1 tablet (75 mg total) by mouth 2 (two) times a day.   FLUOXETINE (PROZAC) 40 MG CAPSULE    TAKE 1 CAPSULE (40 MG TOTAL) BY MOUTH DAILY.   GABAPENTIN (NEURONTIN) 300 MG CAPSULE    Take 1 capsule (300 mg total) by mouth 3 (three) times a day.   HYDROXYZINE (VISTARIL) 50 MG CAPSULE    Take 1-2 capsules (50-100 mg total) by mouth 2 (two) times a day as needed for anxiety Indications: anxious.   MELOXICAM (MOBIC) 15 MG TABLET    Take 1 tablet (15 mg total) by mouth daily as needed for mild pain (1-3). Take with food   METHOCARBAMOL  (ROBAXIN ) 500 MG TABLET    Take 1 tablet (500 mg total) by mouth 3 (three) times a day as needed for muscle spasms.   MULTIVIT-MIN/IRON/FOLIC/HRB186 (HAIR, SKIN AND NAILS ADVANCED ORAL)    Take 1 tablet by mouth every morning.   PREGABALIN (LYRICA) 50 MG CAPSULE    Take 1 capsule (50 mg total) by mouth nightly as needed (nerve pain).  Modified Medications   No medications on file  Discontinued Medications   No medications on file    Physical Exam Vitals and nursing note reviewed.  Constitutional:      General: She is not in acute distress.    Appearance: Normal appearance. She is not ill-appearing, toxic-appearing or diaphoretic.  HENT:     Head: Normocephalic.     Right Ear: External ear normal.     Left Ear: External ear normal.     Mouth/Throat:     Mouth: Mucous membranes are moist.     Comments: Right cheek swelling. No erythema. There is pain with palpation of the cheek but no redness to the gums.  Eyes:     Conjunctiva/sclera: Conjunctivae normal.     Pupils: Pupils are equal, round, and reactive to light.  Cardiovascular:     Rate and Rhythm: Normal rate and regular rhythm.     Pulses: Normal pulses.     Heart sounds: No murmur heard. Pulmonary:     Effort: Pulmonary effort is normal. No respiratory distress.     Breath sounds:  Normal breath sounds. No stridor.  Abdominal:     General: Abdomen is flat.     Palpations: Abdomen is soft.  Musculoskeletal:        General: No swelling, tenderness, deformity or signs of  injury.     Right lower leg: No edema.     Left lower leg: No edema.  Skin:    General: Skin is warm and dry.     Capillary Refill: Capillary refill takes less than 2 seconds.     Findings: No bruising, erythema or rash.  Neurological:     General: No focal deficit present.     Mental Status: She is alert and oriented to person, place, and time. Mental status is at baseline.     Sensory: No sensory deficit.     Motor: No weakness.     Coordination: Coordination normal.     Gait: Gait normal.  Psychiatric:        Mood and Affect: Mood normal.        Assessment and Plan:     Assessment & Plan 1. Right sided facial pain: - Persistent jaw pain and swelling worsened after a recent root canal and capping procedure. History of traumatic injury to the face may have led to a cracked tooth and subsequent infection. - Previous antibiotics provided temporary relief, but symptoms returned after completing the course. - Advised to go to the emergency room for a CT scan to assess for bone involvement and potential IV antibiotics or drainage. Patient states that she is going to go to the emergency department now.  - If an ENT referral is not placed in the emergency department will place referral after discharge. Requested the patient let me know.      Discussed the prescription(s) noted above, including potential side effects, risks/benefits, drug interactions, instructions for taking the medication(s), and the consequences of not taking as recommended.  Patient verbalized an understanding of this information and instructions.  They declined any further questions.  The patient understands to report or return to the clinic for any side effects or concerns, as needed.  All questions have been answered to  patient's satisfaction. After visit instructions were provided at the end of the encounter to enforce goals and plan of care.  Return for Next scheduled follow up..  Otherwise, the patient will report or return to clinic for any new or worsening symptoms, as needed.  The patient agrees with the above plan, and verbalizes understanding.     Electronically signed by: Lyle Vicci Gee, FNP 01/31/2024 3:41 PM        [1] Patient Active Problem List Diagnosis   Recurrent major depressive disorder   Generalized anxiety disorder   Mild intermittent asthma without complication   Perennial allergic rhinitis with seasonal variation   Vitamin D deficiency   Right ankle injury   Right sided facial pain   Cervical stenosis of spinal canal   Spinal stenosis of cervical region  [2] Past Medical History: Diagnosis Date   Arthritis    Closed blow-out fracture of right orbit (CMD)    Generalized anxiety disorder    Mild intermittent asthma without complication (CMD)    Perennial allergic rhinitis with seasonal variation    Recurrent major depressive disorder    Varicella    Visual impairment   [3] Past Surgical History: Procedure Laterality Date   CERVICAL DISC SURGERY N/A 12/14/2022   Cervical 5 / Cervical 6 Anterior Cervical Discectomy and Arthroplasty, and all indicated procedures performed by Rajbir Dennise Mantis, MD at [facility] OR   KNEE ARTHROSCOPY W/ ACL RECONSTRUCTION Right 2004   Procedure: KNEE ARTHROSCOPY W/ ACL RECONSTRUCTION   ORBITAL FRACTURE SURGERY Right    Procedure: ORBITAL FRACTURE SURGERY;  Transantral  endoscopic repair of orbital floor with absorbable implant   WISDOM TOOTH EXTRACTION     Procedure: WISDOM TOOTH EXTRACTION  [4] Family History Problem Relation Name Age of Onset   Hypertension Mother Violet rorie    Tremor Mother Violet rorie    Hyperlipidemia Mother Violet rorie    Depression Mother Violet rorie    Hypertension Father  essynce munsch    Emphysema Father glory graefe jr    Hyperlipidemia Father Moses dispensing optician    Diabetes Father kenyah luba jr    Heart disease Father sheryle vice jr    COPD Father collie kittel jr    Drug abuse Father soledad budreau jr    Hypertension Brother Donald scurlock    Hyperlipidemia Brother Donald scurlock    Stroke Other     Alcohol abuse Sister Cheri rorie    Cancer Neg Hx    [5] No Known Allergies

## 2024-01-31 NOTE — ED Triage Notes (Signed)
 Reports swelling of R side of face/mouth since last night. Has had dental procedures but unable to identify root cause.  States has had multiple episodes of swelling and numbness in R side of face for 5 years    Denies difficulty breathing or swallowing
